# Patient Record
Sex: Female | Born: 1975
Health system: Southern US, Community
[De-identification: ages and names within clinical notes are randomized; demographics above are authoritative.]

## PROBLEM LIST (undated history)

## (undated) DIAGNOSIS — N189 Chronic kidney disease, unspecified: Secondary | ICD-10-CM

## (undated) DIAGNOSIS — E559 Vitamin D deficiency, unspecified: Secondary | ICD-10-CM

## (undated) DIAGNOSIS — F32A Depression, unspecified: Secondary | ICD-10-CM

## (undated) DIAGNOSIS — R002 Palpitations: Secondary | ICD-10-CM

## (undated) DIAGNOSIS — S83511A Sprain of anterior cruciate ligament of right knee, initial encounter: Secondary | ICD-10-CM

## (undated) DIAGNOSIS — S83249A Other tear of medial meniscus, current injury, unspecified knee, initial encounter: Secondary | ICD-10-CM

## (undated) DIAGNOSIS — J45909 Unspecified asthma, uncomplicated: Secondary | ICD-10-CM

## (undated) DIAGNOSIS — F419 Anxiety disorder, unspecified: Secondary | ICD-10-CM

## (undated) HISTORY — DX: Anxiety disorder, unspecified: F41.9

## (undated) HISTORY — DX: Chronic kidney disease, unspecified: N18.9

## (undated) HISTORY — DX: Vitamin D deficiency, unspecified: E55.9

## (undated) HISTORY — DX: Depression, unspecified: F32.A

## (undated) HISTORY — PX: TONSILLECTOMY AND ADENOIDECTOMY: SUR1326

---

## 1998-01-04 ENCOUNTER — Emergency Department (HOSPITAL_COMMUNITY): Admission: EM | Admit: 1998-01-04 | Discharge: 1998-01-04 | Payer: Self-pay | Admitting: Emergency Medicine

## 1999-05-12 ENCOUNTER — Emergency Department (HOSPITAL_COMMUNITY): Admission: EM | Admit: 1999-05-12 | Discharge: 1999-05-12 | Payer: Self-pay | Admitting: Emergency Medicine

## 1999-08-08 ENCOUNTER — Encounter: Payer: Self-pay | Admitting: *Deleted

## 1999-08-08 ENCOUNTER — Ambulatory Visit (HOSPITAL_COMMUNITY): Admission: RE | Admit: 1999-08-08 | Discharge: 1999-08-08 | Payer: Self-pay | Admitting: *Deleted

## 1999-09-26 ENCOUNTER — Inpatient Hospital Stay (HOSPITAL_COMMUNITY): Admission: AD | Admit: 1999-09-26 | Discharge: 1999-09-26 | Payer: Self-pay | Admitting: *Deleted

## 1999-09-27 ENCOUNTER — Inpatient Hospital Stay (HOSPITAL_COMMUNITY): Admission: AD | Admit: 1999-09-27 | Discharge: 1999-09-28 | Payer: Self-pay | Admitting: *Deleted

## 1999-09-27 ENCOUNTER — Encounter (INDEPENDENT_AMBULATORY_CARE_PROVIDER_SITE_OTHER): Payer: Self-pay | Admitting: Specialist

## 1999-09-29 ENCOUNTER — Encounter: Admission: RE | Admit: 1999-09-29 | Discharge: 1999-11-14 | Payer: Self-pay | Admitting: *Deleted

## 2000-06-08 ENCOUNTER — Inpatient Hospital Stay (HOSPITAL_COMMUNITY): Admission: AD | Admit: 2000-06-08 | Discharge: 2000-06-08 | Payer: Self-pay | Admitting: *Deleted

## 2000-08-12 ENCOUNTER — Emergency Department (HOSPITAL_COMMUNITY): Admission: EM | Admit: 2000-08-12 | Discharge: 2000-08-12 | Payer: Self-pay | Admitting: Emergency Medicine

## 2001-10-13 ENCOUNTER — Other Ambulatory Visit: Admission: RE | Admit: 2001-10-13 | Discharge: 2001-10-13 | Payer: Self-pay | Admitting: Obstetrics and Gynecology

## 2002-12-06 ENCOUNTER — Other Ambulatory Visit: Admission: RE | Admit: 2002-12-06 | Discharge: 2002-12-06 | Payer: Self-pay | Admitting: Obstetrics and Gynecology

## 2003-06-10 ENCOUNTER — Emergency Department (HOSPITAL_COMMUNITY): Admission: EM | Admit: 2003-06-10 | Discharge: 2003-06-10 | Payer: Self-pay | Admitting: Emergency Medicine

## 2003-11-26 ENCOUNTER — Emergency Department (HOSPITAL_COMMUNITY): Admission: EM | Admit: 2003-11-26 | Discharge: 2003-11-26 | Payer: Self-pay | Admitting: Family Medicine

## 2003-12-01 ENCOUNTER — Emergency Department (HOSPITAL_COMMUNITY): Admission: EM | Admit: 2003-12-01 | Discharge: 2003-12-01 | Payer: Self-pay | Admitting: Family Medicine

## 2003-12-05 ENCOUNTER — Emergency Department (HOSPITAL_COMMUNITY): Admission: EM | Admit: 2003-12-05 | Discharge: 2003-12-05 | Payer: Self-pay | Admitting: Family Medicine

## 2003-12-12 ENCOUNTER — Emergency Department (HOSPITAL_COMMUNITY): Admission: EM | Admit: 2003-12-12 | Discharge: 2003-12-12 | Payer: Self-pay | Admitting: Emergency Medicine

## 2003-12-13 ENCOUNTER — Emergency Department (HOSPITAL_COMMUNITY): Admission: EM | Admit: 2003-12-13 | Discharge: 2003-12-13 | Payer: Self-pay | Admitting: Family Medicine

## 2004-01-04 ENCOUNTER — Other Ambulatory Visit: Admission: RE | Admit: 2004-01-04 | Discharge: 2004-01-04 | Payer: Self-pay | Admitting: Obstetrics and Gynecology

## 2004-05-30 ENCOUNTER — Inpatient Hospital Stay (HOSPITAL_COMMUNITY): Admission: AD | Admit: 2004-05-30 | Discharge: 2004-05-30 | Payer: Self-pay | Admitting: Obstetrics and Gynecology

## 2005-01-17 ENCOUNTER — Encounter: Admission: RE | Admit: 2005-01-17 | Discharge: 2005-01-17 | Payer: Self-pay | Admitting: Cardiovascular Disease

## 2005-02-06 ENCOUNTER — Other Ambulatory Visit: Admission: RE | Admit: 2005-02-06 | Discharge: 2005-02-06 | Payer: Self-pay | Admitting: Obstetrics and Gynecology

## 2005-02-11 ENCOUNTER — Encounter: Admission: RE | Admit: 2005-02-11 | Discharge: 2005-03-25 | Payer: Self-pay | Admitting: Orthopedic Surgery

## 2005-02-14 ENCOUNTER — Encounter: Admission: RE | Admit: 2005-02-14 | Discharge: 2005-02-14 | Payer: Self-pay | Admitting: Obstetrics and Gynecology

## 2005-03-06 ENCOUNTER — Other Ambulatory Visit: Admission: RE | Admit: 2005-03-06 | Discharge: 2005-03-06 | Payer: Self-pay | Admitting: Obstetrics and Gynecology

## 2005-10-20 ENCOUNTER — Emergency Department (HOSPITAL_COMMUNITY): Admission: EM | Admit: 2005-10-20 | Discharge: 2005-10-20 | Payer: Self-pay | Admitting: Emergency Medicine

## 2006-04-20 ENCOUNTER — Other Ambulatory Visit: Admission: RE | Admit: 2006-04-20 | Discharge: 2006-04-20 | Payer: Self-pay | Admitting: Obstetrics and Gynecology

## 2006-11-11 ENCOUNTER — Inpatient Hospital Stay (HOSPITAL_COMMUNITY): Admission: AD | Admit: 2006-11-11 | Discharge: 2006-11-11 | Payer: Self-pay | Admitting: Obstetrics and Gynecology

## 2008-08-01 ENCOUNTER — Inpatient Hospital Stay (HOSPITAL_COMMUNITY): Admission: AD | Admit: 2008-08-01 | Discharge: 2008-08-01 | Payer: Self-pay | Admitting: Obstetrics and Gynecology

## 2008-09-15 ENCOUNTER — Inpatient Hospital Stay (HOSPITAL_COMMUNITY): Admission: AD | Admit: 2008-09-15 | Discharge: 2008-10-09 | Payer: Self-pay | Admitting: Obstetrics and Gynecology

## 2008-09-15 ENCOUNTER — Encounter: Payer: Self-pay | Admitting: Obstetrics and Gynecology

## 2008-09-22 ENCOUNTER — Encounter: Payer: Self-pay | Admitting: Obstetrics and Gynecology

## 2008-10-03 ENCOUNTER — Encounter: Payer: Self-pay | Admitting: Obstetrics and Gynecology

## 2008-10-09 ENCOUNTER — Encounter: Payer: Self-pay | Admitting: Obstetrics and Gynecology

## 2008-10-10 ENCOUNTER — Encounter: Admission: RE | Admit: 2008-10-10 | Discharge: 2008-10-10 | Payer: Self-pay | Admitting: Obstetrics and Gynecology

## 2008-12-20 ENCOUNTER — Inpatient Hospital Stay (HOSPITAL_COMMUNITY): Admission: AD | Admit: 2008-12-20 | Discharge: 2008-12-23 | Payer: Self-pay | Admitting: Obstetrics and Gynecology

## 2009-05-05 IMAGING — US US OB COMP +14 WK
2 series · 14 of 28 positions shown · non-contrast
Comparison: none

OBSTETRICAL ULTRASOUND:
 This ultrasound exam was performed in the [HOSPITAL] Ultrasound Department.  The OB US report was generated in the AS system, and faxed to the ordering physician.  This report is also available in [REDACTED] PACS.

[Series 1: us ob comp +14 wk · 9 of 57 slices shown (1 of 2)]
[im 4/57]
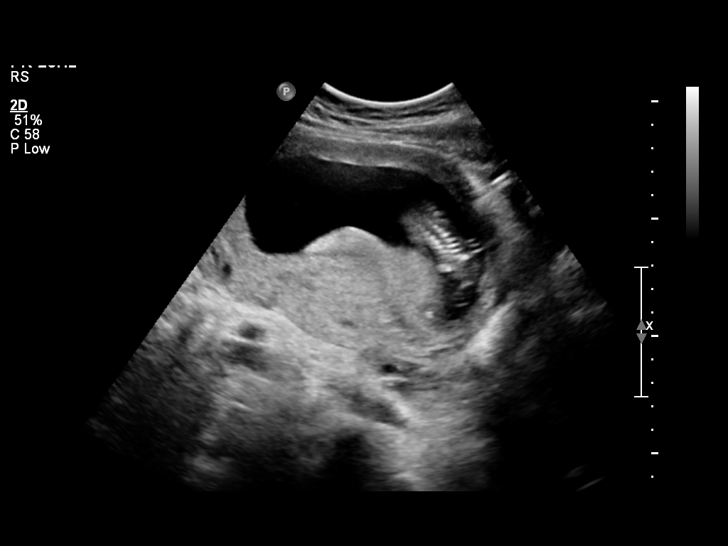
[im 10/57]
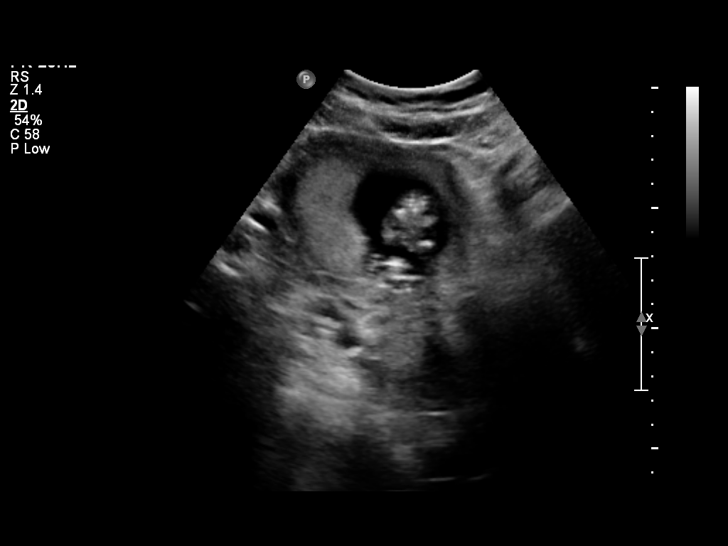
[im 16/57]
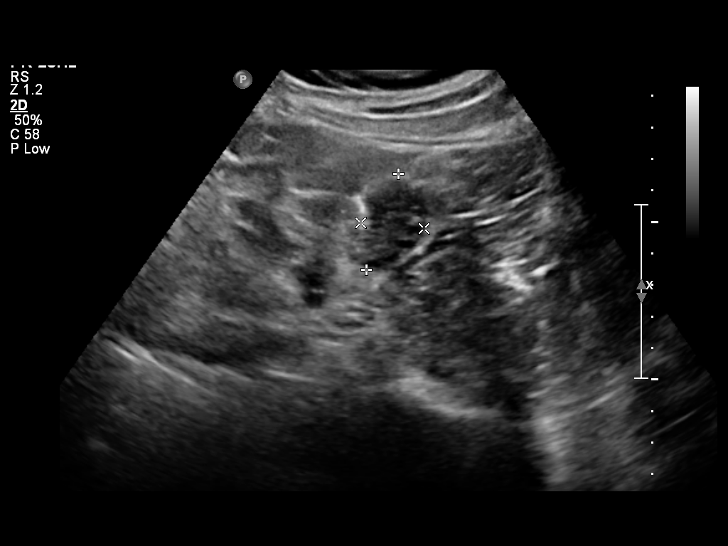
[im 22/57]
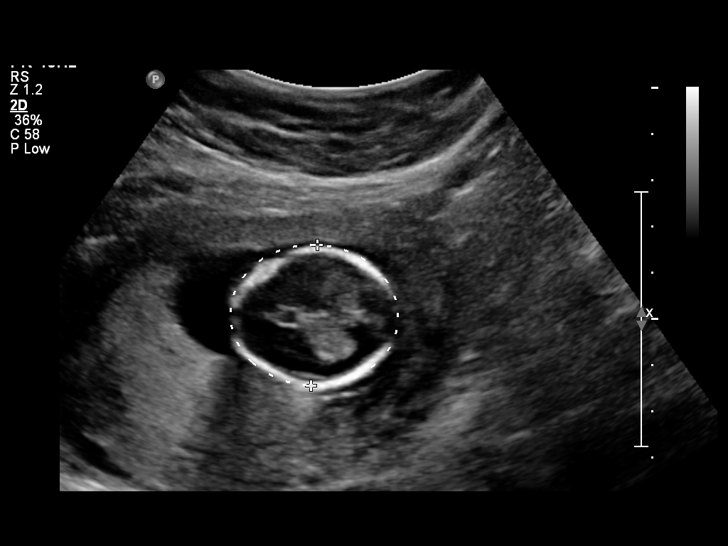
[im 29/57]
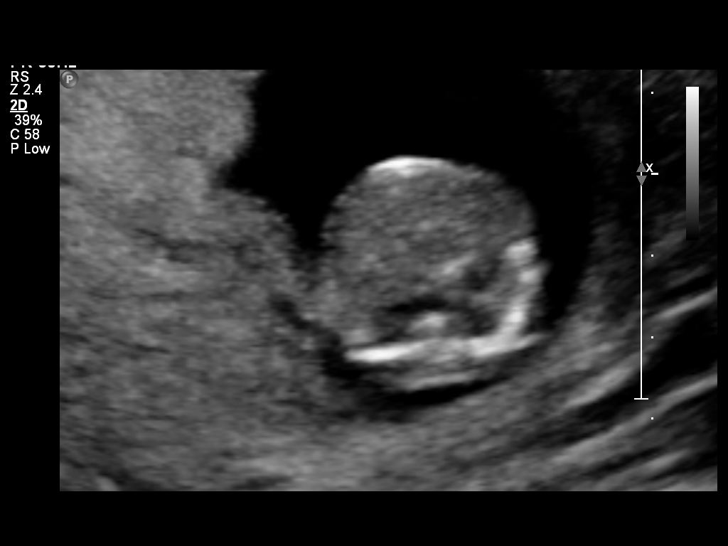
[im 35/57]
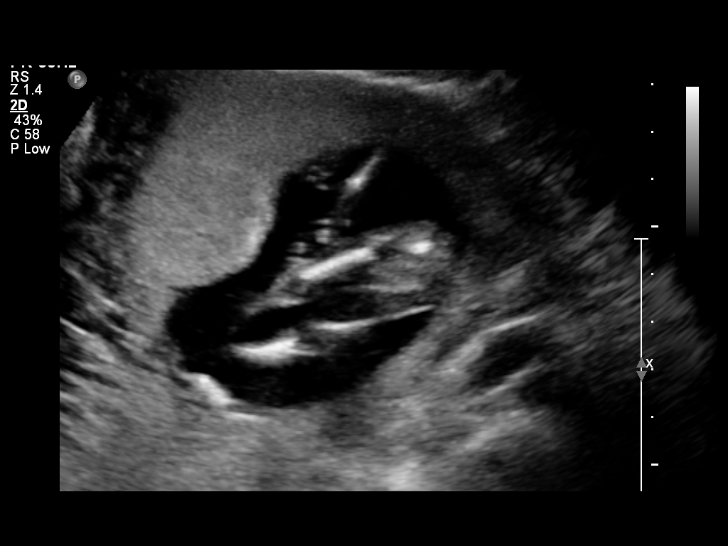
[im 41/57]
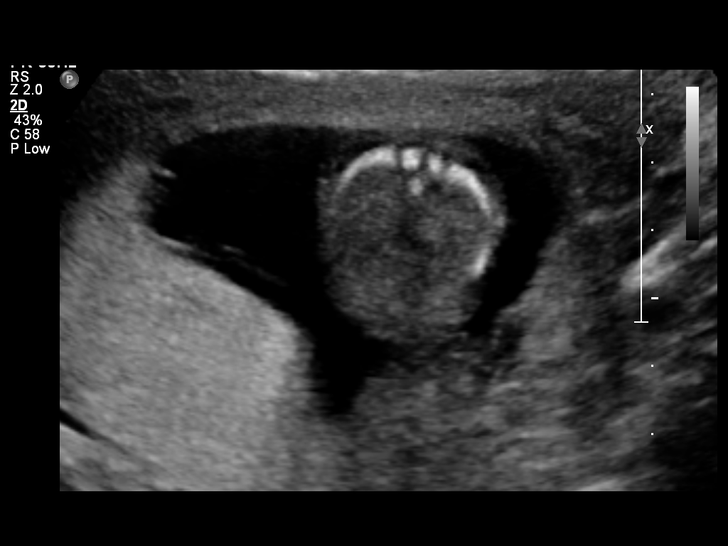
[im 47/57]
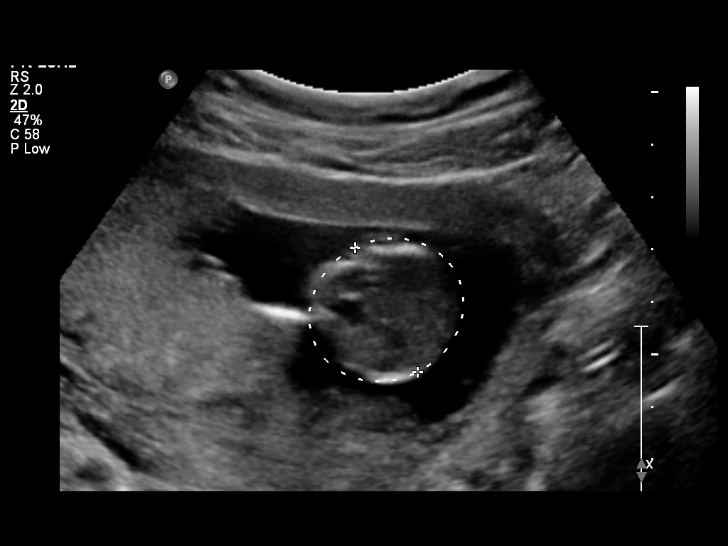
[im 53/57]
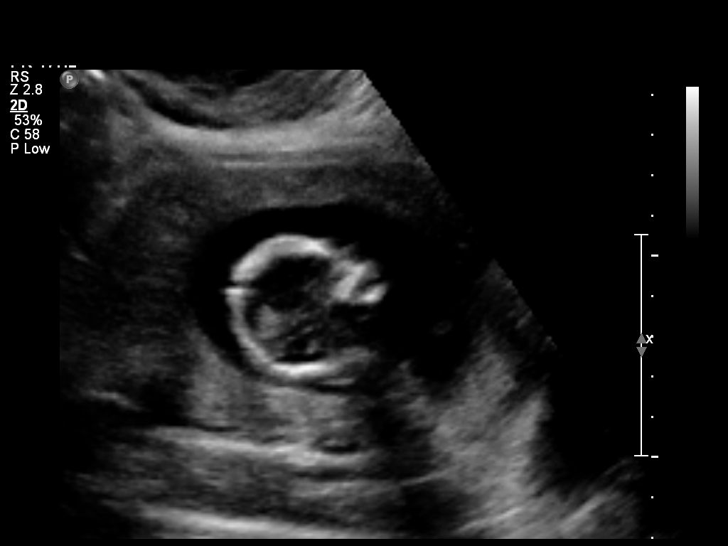

[Series 1: us ob comp +14 wk · 5 of 29 slices shown (2 of 2)]
[im 1/29]
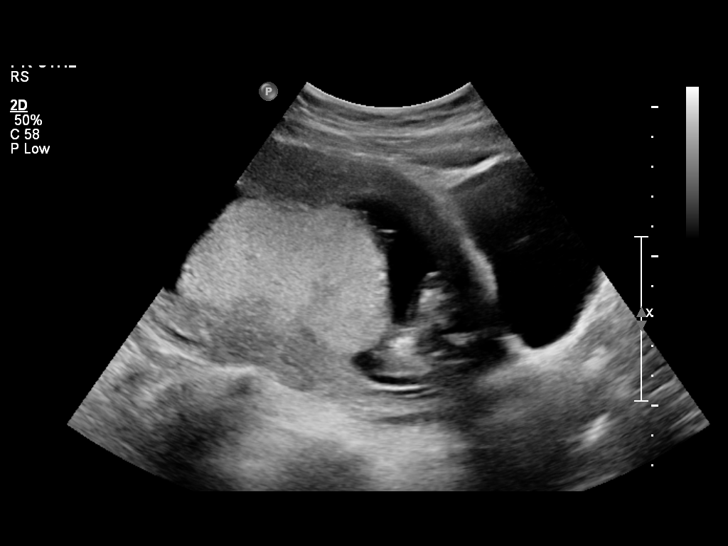
[im 8/29]
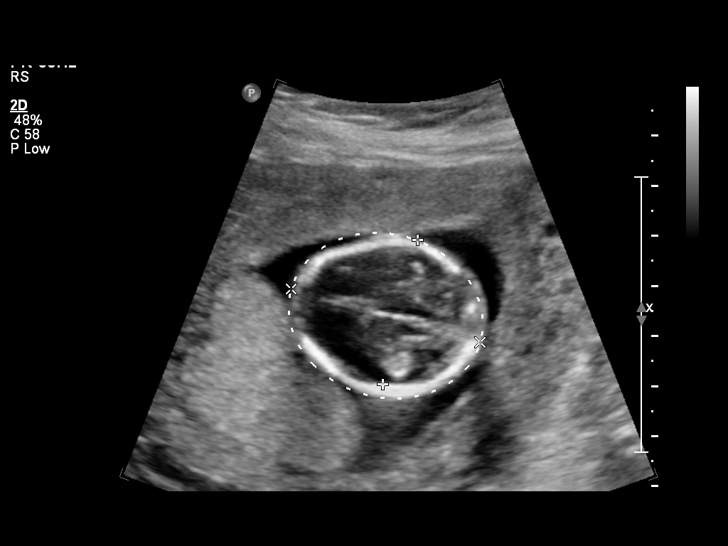
[im 15/29]
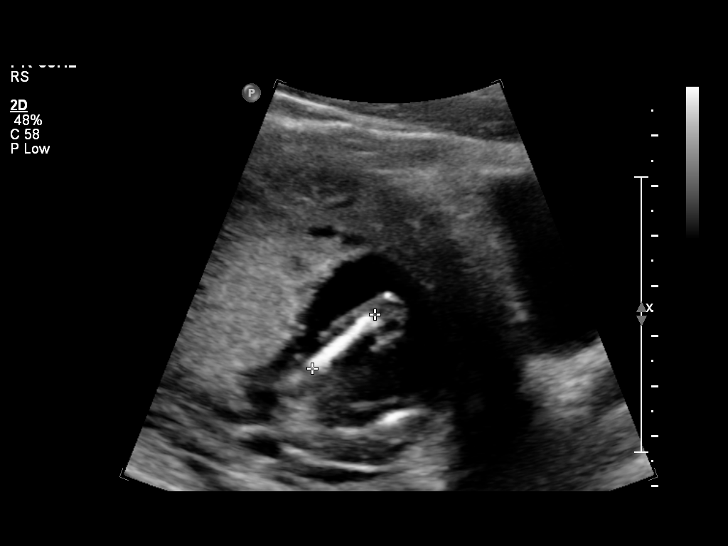
[im 22/29]
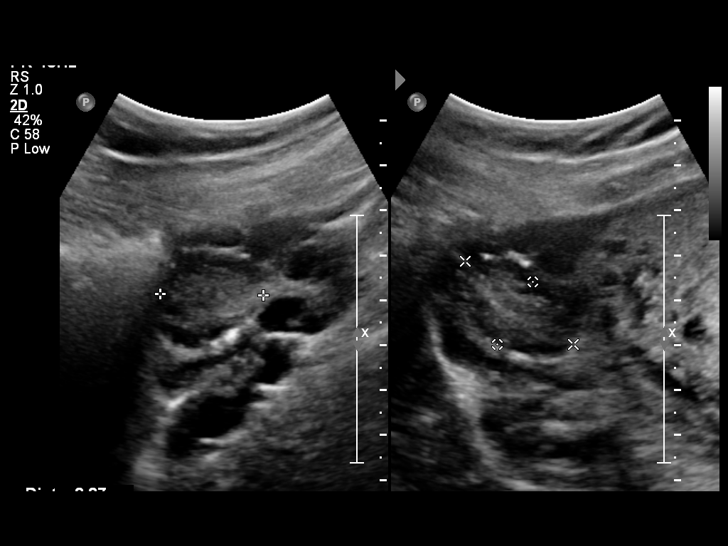
[im 29/29]
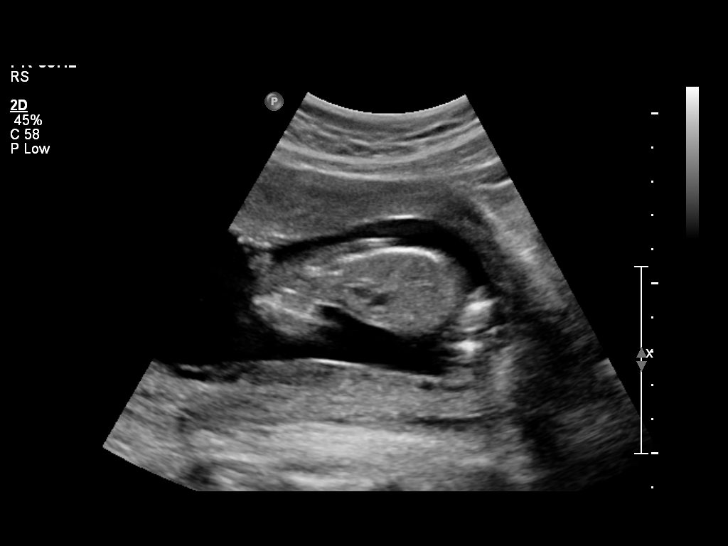

[14 of 28 positions shown; findings below may reference images not displayed]

IMPRESSION: See AS Obstetric US report.

## 2009-09-24 ENCOUNTER — Inpatient Hospital Stay (HOSPITAL_COMMUNITY): Admission: AD | Admit: 2009-09-24 | Discharge: 2009-09-24 | Payer: Self-pay | Admitting: Obstetrics and Gynecology

## 2010-06-15 ENCOUNTER — Encounter: Payer: Self-pay | Admitting: Obstetrics and Gynecology

## 2010-09-01 LAB — CBC
HCT: 27 % — ABNORMAL LOW (ref 36.0–46.0)
HCT: 35.9 % — ABNORMAL LOW (ref 36.0–46.0)
Hemoglobin: 11.9 g/dL — ABNORMAL LOW (ref 12.0–15.0)
Hemoglobin: 9 g/dL — ABNORMAL LOW (ref 12.0–15.0)
MCHC: 33.3 g/dL (ref 30.0–36.0)
MCHC: 33.5 g/dL (ref 30.0–36.0)
MCV: 81.7 fL (ref 78.0–100.0)
MCV: 82.2 fL (ref 78.0–100.0)
Platelets: 193 10*3/uL (ref 150–400)
Platelets: 231 10*3/uL (ref 150–400)
RBC: 3.29 MIL/uL — ABNORMAL LOW (ref 3.87–5.11)
RBC: 4.39 MIL/uL (ref 3.87–5.11)
RDW: 14.5 % (ref 11.5–15.5)
RDW: 14.9 % (ref 11.5–15.5)
WBC: 12.1 10*3/uL — ABNORMAL HIGH (ref 4.0–10.5)
WBC: 9.3 10*3/uL (ref 4.0–10.5)

## 2010-09-01 LAB — RPR: RPR Ser Ql: NONREACTIVE

## 2010-09-01 LAB — GLUCOSE, CAPILLARY
Glucose-Capillary: 100 mg/dL — ABNORMAL HIGH (ref 70–99)
Glucose-Capillary: 70 mg/dL (ref 70–99)
Glucose-Capillary: 91 mg/dL (ref 70–99)
Glucose-Capillary: 98 mg/dL (ref 70–99)

## 2010-09-01 LAB — STREP B DNA PROBE: Strep Group B Ag: NEGATIVE

## 2010-09-03 LAB — GLUCOSE, CAPILLARY
Glucose-Capillary: 102 mg/dL — ABNORMAL HIGH (ref 70–99)
Glucose-Capillary: 105 mg/dL — ABNORMAL HIGH (ref 70–99)
Glucose-Capillary: 106 mg/dL — ABNORMAL HIGH (ref 70–99)
Glucose-Capillary: 117 mg/dL — ABNORMAL HIGH (ref 70–99)
Glucose-Capillary: 127 mg/dL — ABNORMAL HIGH (ref 70–99)
Glucose-Capillary: 144 mg/dL — ABNORMAL HIGH (ref 70–99)
Glucose-Capillary: 55 mg/dL — ABNORMAL LOW (ref 70–99)
Glucose-Capillary: 56 mg/dL — ABNORMAL LOW (ref 70–99)
Glucose-Capillary: 82 mg/dL (ref 70–99)
Glucose-Capillary: 97 mg/dL (ref 70–99)
Glucose-Capillary: 98 mg/dL (ref 70–99)
Glucose-Capillary: 100 mg/dL — ABNORMAL HIGH (ref 70–99)
Glucose-Capillary: 101 mg/dL — ABNORMAL HIGH (ref 70–99)
Glucose-Capillary: 103 mg/dL — ABNORMAL HIGH (ref 70–99)
Glucose-Capillary: 104 mg/dL — ABNORMAL HIGH (ref 70–99)
Glucose-Capillary: 104 mg/dL — ABNORMAL HIGH (ref 70–99)
Glucose-Capillary: 105 mg/dL — ABNORMAL HIGH (ref 70–99)
Glucose-Capillary: 107 mg/dL — ABNORMAL HIGH (ref 70–99)
Glucose-Capillary: 108 mg/dL — ABNORMAL HIGH (ref 70–99)
Glucose-Capillary: 108 mg/dL — ABNORMAL HIGH (ref 70–99)
Glucose-Capillary: 108 mg/dL — ABNORMAL HIGH (ref 70–99)
Glucose-Capillary: 108 mg/dL — ABNORMAL HIGH (ref 70–99)
Glucose-Capillary: 109 mg/dL — ABNORMAL HIGH (ref 70–99)
Glucose-Capillary: 110 mg/dL — ABNORMAL HIGH (ref 70–99)
Glucose-Capillary: 114 mg/dL — ABNORMAL HIGH (ref 70–99)
Glucose-Capillary: 114 mg/dL — ABNORMAL HIGH (ref 70–99)
Glucose-Capillary: 115 mg/dL — ABNORMAL HIGH (ref 70–99)
Glucose-Capillary: 118 mg/dL — ABNORMAL HIGH (ref 70–99)
Glucose-Capillary: 125 mg/dL — ABNORMAL HIGH (ref 70–99)
Glucose-Capillary: 126 mg/dL — ABNORMAL HIGH (ref 70–99)
Glucose-Capillary: 131 mg/dL — ABNORMAL HIGH (ref 70–99)
Glucose-Capillary: 131 mg/dL — ABNORMAL HIGH (ref 70–99)
Glucose-Capillary: 131 mg/dL — ABNORMAL HIGH (ref 70–99)
Glucose-Capillary: 132 mg/dL — ABNORMAL HIGH (ref 70–99)
Glucose-Capillary: 132 mg/dL — ABNORMAL HIGH (ref 70–99)
Glucose-Capillary: 135 mg/dL — ABNORMAL HIGH (ref 70–99)
Glucose-Capillary: 136 mg/dL — ABNORMAL HIGH (ref 70–99)
Glucose-Capillary: 137 mg/dL — ABNORMAL HIGH (ref 70–99)
Glucose-Capillary: 138 mg/dL — ABNORMAL HIGH (ref 70–99)
Glucose-Capillary: 140 mg/dL — ABNORMAL HIGH (ref 70–99)
Glucose-Capillary: 143 mg/dL — ABNORMAL HIGH (ref 70–99)
Glucose-Capillary: 143 mg/dL — ABNORMAL HIGH (ref 70–99)
Glucose-Capillary: 143 mg/dL — ABNORMAL HIGH (ref 70–99)
Glucose-Capillary: 147 mg/dL — ABNORMAL HIGH (ref 70–99)
Glucose-Capillary: 150 mg/dL — ABNORMAL HIGH (ref 70–99)
Glucose-Capillary: 151 mg/dL — ABNORMAL HIGH (ref 70–99)
Glucose-Capillary: 163 mg/dL — ABNORMAL HIGH (ref 70–99)
Glucose-Capillary: 203 mg/dL — ABNORMAL HIGH (ref 70–99)
Glucose-Capillary: 55 mg/dL — ABNORMAL LOW (ref 70–99)
Glucose-Capillary: 60 mg/dL — ABNORMAL LOW (ref 70–99)
Glucose-Capillary: 71 mg/dL (ref 70–99)
Glucose-Capillary: 78 mg/dL (ref 70–99)
Glucose-Capillary: 79 mg/dL (ref 70–99)
Glucose-Capillary: 79 mg/dL (ref 70–99)
Glucose-Capillary: 85 mg/dL (ref 70–99)
Glucose-Capillary: 86 mg/dL (ref 70–99)
Glucose-Capillary: 86 mg/dL (ref 70–99)
Glucose-Capillary: 87 mg/dL (ref 70–99)
Glucose-Capillary: 88 mg/dL (ref 70–99)
Glucose-Capillary: 88 mg/dL (ref 70–99)
Glucose-Capillary: 88 mg/dL (ref 70–99)
Glucose-Capillary: 89 mg/dL (ref 70–99)
Glucose-Capillary: 89 mg/dL (ref 70–99)
Glucose-Capillary: 90 mg/dL (ref 70–99)
Glucose-Capillary: 90 mg/dL (ref 70–99)
Glucose-Capillary: 90 mg/dL (ref 70–99)
Glucose-Capillary: 91 mg/dL (ref 70–99)
Glucose-Capillary: 93 mg/dL (ref 70–99)
Glucose-Capillary: 97 mg/dL (ref 70–99)
Glucose-Capillary: 97 mg/dL (ref 70–99)
Glucose-Capillary: 98 mg/dL (ref 70–99)
Glucose-Capillary: 98 mg/dL (ref 70–99)
Glucose-Capillary: 99 mg/dL (ref 70–99)
Glucose-Capillary: 99 mg/dL (ref 70–99)

## 2010-09-04 LAB — STREP B DNA PROBE: Strep Group B Ag: NEGATIVE

## 2010-09-04 LAB — GLUCOSE, CAPILLARY
Glucose-Capillary: 150 mg/dL — ABNORMAL HIGH (ref 70–99)
Glucose-Capillary: 78 mg/dL (ref 70–99)

## 2010-09-04 LAB — CBC
HCT: 32.3 % — ABNORMAL LOW (ref 36.0–46.0)
Hemoglobin: 10.9 g/dL — ABNORMAL LOW (ref 12.0–15.0)
MCHC: 33.8 g/dL (ref 30.0–36.0)
MCV: 82 fL (ref 78.0–100.0)
Platelets: 205 10*3/uL (ref 150–400)
RBC: 3.93 MIL/uL (ref 3.87–5.11)
RDW: 14.2 % (ref 11.5–15.5)
WBC: 10.5 10*3/uL (ref 4.0–10.5)

## 2010-09-04 LAB — GLUCOSE, 3 HOUR GESTATIONAL: Glucose, GTT - 3 Hour: 124 mg/dL (ref 70–144)

## 2010-09-04 LAB — GC/CHLAMYDIA PROBE AMP, GENITAL
Chlamydia, DNA Probe: NEGATIVE
GC Probe Amp, Genital: NEGATIVE

## 2010-09-04 LAB — GLUCOSE, 1 HOUR GESTATIONAL: Glucose Tolerance, 1 hour: 252 mg/dL — ABNORMAL HIGH (ref 70–189)

## 2010-09-04 LAB — GLUCOSE, FASTING GESTATIONAL: Glucose Tolerance, Fasting: 126 mg/dL

## 2010-09-04 LAB — GLUCOSE, 2 HOUR GESTATIONAL: Glucose Tolerance, 2 hour: 246 mg/dL — ABNORMAL HIGH (ref 70–164)

## 2010-09-05 LAB — GC/CHLAMYDIA PROBE AMP, GENITAL
Chlamydia, DNA Probe: NEGATIVE
GC Probe Amp, Genital: NEGATIVE

## 2010-09-05 LAB — WET PREP, GENITAL
Clue Cells Wet Prep HPF POC: NONE SEEN
Yeast Wet Prep HPF POC: NONE SEEN

## 2010-09-05 LAB — URINE MICROSCOPIC-ADD ON

## 2010-09-05 LAB — URINE CULTURE
Colony Count: 25000
Special Requests: NEGATIVE

## 2010-09-05 LAB — URINALYSIS, ROUTINE W REFLEX MICROSCOPIC
Bilirubin Urine: NEGATIVE
Glucose, UA: NEGATIVE mg/dL
Hgb urine dipstick: NEGATIVE
Ketones, ur: NEGATIVE mg/dL
Nitrite: NEGATIVE
Protein, ur: NEGATIVE mg/dL
Specific Gravity, Urine: 1.02 (ref 1.005–1.030)
Urobilinogen, UA: 0.2 mg/dL (ref 0.0–1.0)
pH: 7 (ref 5.0–8.0)

## 2010-10-08 NOTE — H&P (Signed)
NAMEJACKLINE, Zoe Morris            ACCOUNT NO.:  [id number]   MEDICAL RECORD NO.:  [id number]          PATIENT TYPE:  INP   LOCATION:  9175                          FACILITY:  [facility]   PHYSICIAN:  Naima A. Dillard, M.D. DATE OF BIRTH:  1975/11/26   DATE OF ADMISSION:  12/20/2008  DATE OF DISCHARGE:                              HISTORY & PHYSICAL   [name] is a 35 year old gravida 8, para 0-3-4-3 at 35.2 weeks with  cerclage in place since 21-1/2 weeks' gestation.  She presents tonight  with a history of spontaneous rupture of membranes, clear fluid at 8:20  p.m.Marland Kitchen  She is followed by the doctors at [facility] and her  pregnancy is remarkable for history of preterm premature rupture of  membranes with two prior pregnancies, history of preterm delivery prior  to 57 weeks' gestation with her three other children, history of cardiac  problems and cardiac evaluation, history of gestational diabetes, late  to care, childhood asthma, abnormal Pap and cryotherapy, septate uterus  and gestational diabetes, and rescue cerclage with this pregnancy and  antenatal hospitalization from 21-[redacted] weeks gestation, history of  Trichomonas.   PRENATAL LABS:  [name] prenatal labs included an initial  hemoglobin and hematocrit of 11.5 and 34.2, platelets 243,000, blood  type A plus, antibody screen negative, sickle cell trait screen  negative, rubella immune, hepatitis B negative, HIV nonreactive.  Pap  normal.  Negative gonorrhea and chlamydia.  Quad screen negative.  Failed 1-hour Glucola, failed 3-hour Glucola.  She had a negative beta  strep test during her hospitalization in April.  But she did not have a  more recent beta strep culture so one was done today upon her admission.   ALLERGIES:  [name] has no known allergies to food, medications or  latex.   CURRENT MEDICATIONS:  1. Glyburide 2.5 mg daily.  2. Prenatal vitamin daily.   HISTORY OF PRESENT PREGNANCY:  Began  prenatal care at 16.5 weeks with an  OB interview at 18.2 weeks.  She had her first visit concurrent with her  anatomy ultrasound.  At that time she was given some Flagyl for  bacterial vaginosis and a quad screen was done which came back normal  and 17P was begun per history of preterm delivery.  She was sent over  maternal fetal medicine for a consult that was recommended by Dr.  Pennie Rushing.  At that maternal fetal medicine visit she was found to have a  cervical shortening and was sent directly over to the antenatal unit for  inpatient admission and the next day she got her rescue cerclage.  She  was 21-1/2 weeks at that time.  She remained hospitalized in the  antenatal unit until 25 weeks.  During that time she was diagnosed with  gestational diabetes and she received two doses of betamethasone on May  9 at [redacted] weeks gestation.  She continued to have her 17P shot and was  seen regularly at the office following her gestational diabetes and her  fetal growth.  At 32 weeks she began her twice weekly NSTs and she had a  couple follow-up ultrasounds and normal BPPs, normal growth and normal  AFI were found.  She presents today with spontaneous rupture of  membranes while walking.  She had been planning to have her cerclage  removed at 37 weeks I believe.  She is currently 35 weeks and 2 days.   OBSTETRICAL HISTORY:  [name] has been pregnant eight times.  Gravida #1 resulted in the birth of a son born in October 1991 at 26  weeks.  He weighed 2 pounds 3 ounces.  Gravida #2 resulted in the birth  of another son born in September 11, 1993 at 27 weeks.  He weighed 2 pounds  8 ounces.  Gravida #1 and II were prolonged premature rupture of  membranes.  Gravida #3 resulted in the birth of a daughter born in May  2001 at 4 weeks' gestation.  She weighed 2 pounds 10 ounces.  She did  not have prolonged premature rupture of membranes, just a preterm  delivery.  Gravida #4 termination in 2002.   Gravida #5 a termination in  2006.  Gravida #6 termination in 2008.  Gravida #7 termination in 2009.  Gravida #8 current pregnancy.   MEDICAL HISTORY:  [name] has a history of prolonged premature  rupture of membranes x2 pregnancies and preterm delivery less than 30  weeks x3 pregnancies.  She has used birth control the past including the  patch and Femcon.  She had an abnormal Pap and cryotherapy in 1992.  She  had a diagnosis of septate uterus made on the ultrasound with maternal  fetal medicine and she has a history of Trichomonas in March of this  year.  She had the usual childhood illnesses including chicken pox.  She  had childhood asthma and has not needed an inhaler for more than 20  years.  She had a cardiac workup for palpitations and arrhythmia with  Dr. Algie Coffer on evaluation a year and half ago but says she did not go  back for a follow-up visit.  She has a varicose vein.   FAMILY HISTORY:  Her paternal grandmother has a pacemaker and paternal  grandmother and mother, paternal uncle and maternal grandfather all have  chronic hypertension.  Maternal grandmother has varicose veins.  Sister  has anemia.  Paternal grandfather, paternal uncle and cousins have  diabetes.   SURGICAL HISTORY:  The patient had tonsils removed at age 59.   GENETIC HISTORY:  Father of the baby has an extra digit.  Her second  child has an extra digit.  The father of the baby's grandmother has  muscular dystrophy.  The patient had a negative sickle cell screen and a  negative quad screen.   SOCIAL HISTORY:  [name] is single African American.  She declines  to state their religion.  Father of the baby's name is Tiburcio Bash Little.  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  The father of the  baby has a high school diploma and the patient denies use of cigarettes,  alcohol or street drugs during this pregnancy.   PHYSICAL EXAMINATION:  VITAL SIGNS:  The patient is afebrile today  with  stable vital signs.  HEENT:  Normal.  LUNGS:  Clear to auscultation bilaterally.  HEART:  Heart is regular rate and rhythm without murmurs.  BREASTS:  Soft and nontender.  ABDOMEN:  Abdomen is soft and nontender.  Gravid, appropriate for  gestational age.  Uterus soft to palpation between contractions.  Fetal  heart  rate has a baseline of 145 with variability present and  accelerations present meeting the 15/15 criteria for reactivity.  Uterine contractions are occurring every 7-8 minutes, lasting 60  seconds, of moderate strength.  EXTREMITIES:  There is no edema of extremities.  DTRs are normal.  Denna Haggard' sign is negative x2.  SPECULUM EXAM:  There is copious clear liquids leaking out during  contractions.  Per digital exam of the cervix, the cerclage is in place  and the cervix is dilated 3 cm, minus 3 station.   IMPRESSION:  A 35 year old gravida 8, para 0-3-4-3 at 35.[redacted] weeks  gestation, cerclage in place, spontaneous rupture of membranes, septate  uterus, gestational diabetes.   PLAN:  Admit to labor and delivery, MD management.  Glucose checks  every2 hours.  Dr. Normand Sloop is on her way to remove the cerclage and GBS  cultures have been sent.      Janna Melsness, CNM      Naima A. Normand Sloop, M.D.  Electronically Signed    JM/MEDQ  D:  12/20/2008  T:  12/21/2008  Job:  [id number]

## 2010-10-08 NOTE — Discharge Summary (Signed)
Zoe, Morris            ACCOUNT NO.:  [id number]   MEDICAL RECORD NO.:  [id number]          PATIENT TYPE:  INP   LOCATION:  9152                          FACILITY:  [facility]   PHYSICIAN:  Janine Limbo, M.D.DATE OF BIRTH:  06-07-1975   DATE OF ADMISSION:  09/15/2008  DATE OF DISCHARGE:  10/09/2008                               DISCHARGE SUMMARY   ADMITTING DIAGNOSES:  1. Intrauterine pregnancy at 21-4/7th weeks with shortened cervix and      funneling per ultrasound with MSM.  2. Questionable history of septate uterus.  3. History of preterm delivery x3.  4. Irregular heart rate.  5. Elevated blood glucose.  19. 35 year old gravida 8, para 0-3-4-3.  7. Uterine fibroids.   DISCHARGE DIAGNOSES:  1. Intrauterine pregnancy at 25 weeks.  2. Incompetent cervix status post rescue cerclage September 16, 2008.  3. Stable cervical length with cervix measuring between 2 and 3.2 cm      on Oct 09, 2008.  4. Gestational diabetes, controlled with p.o. glyburide.  5. History of anemia 424 with hemoglobin equal to 10.9.   HOSPITAL PROCEDURES:  1. Betamethasone course which she received her first dose Sep 30, 2008,      and second dose Oct 01, 2008.  2. Spinal anesthesia.  3. Rescue cerclage September 16, 2008.  4. Serial ultrasounds to evaluate cervical length as well as fetal      growth.   HOSPITAL COURSE:  [name] is a 35 year old gravida 8, para 0-3-4-3  who presented on the day of admission 21-4/7th weeks for admission per  Dr. Katrinka Blazing MSM for concerns regarding shortened cervix with funneling.  The patient does have a history of preterm delivery x3 at respectively  26, 27 and 20.  She has been followed by the MD service at [facility].  On her date of admission, she had had a consult with  Dr. Roney Mans at [facility] on September 15, 2008.  Transvaginal ultrasound did show funneling with a functional cervical  length of 1.3 cm.  Prior to that, her last  cervical length on August 23, 2008, was 3.61 cm.  She had been receiving 17-P injections secondary to  her history of preterm birth, which she typically gets them on  Wednesdays.  She had reported one episode of vaginal bleeding during the  pregnancy.  Denied any other contractions or leakage of fluid, did  report to Dr. Katrinka Blazing that she had some increased vaginal pressure, was  standing and doing chores, otherwise her pregnancy had been progressing  well.  Pregnancy and history remarkable for  1. PPROM x2 with her first 2 babies.  2. History of preterm delivery x3.  3. History of cardiac problems with a cardiac evaluation regarding      palpitations and irregular rhythm of her heart rate.  4. Elevated glucose at her prenatal visit on September 14, 2008.  5. History of Trichomonas.  6. Late to care.  7. Childhood history of asthma.  8. History of abnormal Pap and cryotherapy in 1992.  9. History of septate uterus.   She  had a capillary blood glucose equal to 181 on September 14, 2008.  She  began prenatal care at [facility] at 16-1/2 weeks.  She had  anatomy ultrasound that at 18-2/7th weeks, was treated for bacterial  vaginosis at that time.  Quad screen was done and did come back normal.  Secondary to her history, she was sent for consult with Maternal Fetal  Medicine by Dr. Pennie Rushing and then following that consult and results of  ultrasound was sent for direct admission from the office to the  antenatal where she has remained until today Oct 09, 2008.  On  admission, her abdomen was soft and nontender.  Gravid uterus and  umbilicus and appropriate for gestational age.  It was soft to  palpation.  There were no contractions per TOCO.  She had no leakage or  vaginal bleeding from vagina.  She was placed in Trendelenburg on strict  bedrest to evaluate best course of action regarding a shortening cervix.  Cerclage was recommended if no imminent labor and delivery were noted.  Risks and  benefits and alternatives were discussed with the patient by  both Dr. Roney Mans as well as Dr. Dierdre Forth.  Dr. Katrinka Blazing did  also note in her consultation that no uterine anomalies were seen on  ultrasound, however that best imaging of the uterus would be a non-  pregnant state with HSG versus diagnostic laparoscopy versus a  hysteroscopy would be the most helpful and clarifying type of  questionable anomaly per history.  Dr. 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  Fetal heart rate was in the  140s and reassuring on admission.  GBS and GC Chlamydia cultures were  done and were negative.  On physical exam, cervix was closed and long.  No visible dilatation.  As the patient was monitored after admission, no  imminent labor or contractions were noted and the patient did proceed  with rescue cerclage September 16, 2008, was performed by Dr. Pennie Rushing under  spinal anesthesia.  Please see her operative note for any other  clarification regarding the procedure and findings that was performed at  21 weeks and 5/7 days.  On the day following cerclage placement September 17, 2008, the patient was without complaints, was doing well, did have a  small amount of bleeding with wiping and no contractions were noted.   She was on ibuprofen to prevent any preterm contractions, and she did  remain in Trendelenburg.  Plan was made for the patient to have 1-hour  Glucola at 23-4/7th weeks.  The patient did have some bouts with  constipation during her stay.  Juice cocktails as well as diet  modifications, Colace and intermittent MiraLax were utilized during her  hospitalization.  She did have a nutrition consult on September 18, 2008.  The patient was on VTE prophylaxis with both daytime SCDs as well as TED  hose during the day.  The patient did have b.i.d. fetal heart tones  until 23-6/7th weeks mark and since that point she has had NSTs t.i.d.  She has had some periods of  irritability episodes of uterine  irritability.  During her hospitalization fetal heart tones have been  reassuring.  The patient did have pastoral care visits from time-to-time  during this stay secondary to some psychosocial issues and housing needs  and needs were met.  Her vital signs were stable, and she was afebrile  during her stay.  On September 22, 2008, at approximately  22-1/2 weeks, the  patient was diagnosed with gestational diabetes following a 3-hour GTT.  She did have diabetes teaching as well as discussion of checking her  CBGs both fasting and 2-hour postprandial the following day.  She did  have some emotional instability and complaints of depression related to  the day diabetes diagnosis around the Sep 23, 2008, but continued doing  well, denying pain or bleeding, some around the time of the cerclage  placement, continued having good fetal movement.  Fasting blood sugar on  Sep 23, 2008, was 125 and 2-hour postprandial were between 78 and 150.  On Sep 24, 2008, her fasting blood sugar was 105 and before meals were 98  and highest after meal was 143.  Starting on Sep 24, 2008, she was begun  on glyburide 2.5 mg p.o. q.a.m. before breakfast.   On Sep 25, 2008, fasting blood sugar was 101, 2-hour PCs range 79-118.  She did on Sep 26, 2008, have a social work consult.  On Sep 26, 2008,  fasting blood sugar 104, 2-hour postprandials were within normal limits  overall.  On September 22, 2008, the patient did have a followup ultrasound  and AFI was within normal limits.  Cervical length was measuring 1.8 cm,  final length was 1.4 cm, final width was 2.7 cm.  Cerclage was  visualized as well as a nabothian cyst.  Cerclage suture did appear  intact with good placement and funneling was not seen distal to the  cerclage.  Followup ultrasound was done on Oct 03, 2008, cephalic  presentation was noted.  Placenta was right lateral above os.  AFI was  subjectively within normal limits.  Estimated  fetal weight was 1 pound 7  ounces which was 50th percentile on growth fetal heart rate was 157  limited anatomy that was seen was within normal limits.  Cervical length  did improve to 2.6 cm and that was measured transvaginally.  Cerclage  appeared intact and cervix appeared closed without funneling or dynamic  change.  The patient did receive her betamethasone course Sep 30, 2008,  as well as Oct 01, 2008, following that she did have some continued blood  sugar elevations related to the course and was covered with both NPH  insulin as well as NovoLog sliding scale insulin beginning on Oct 02, 2008, until Oct 07, 2008.  When was restarted on glyburide 2.5 mg, did  have some hypoglycemia and thus on Oct 09, 2008, was decreased down to  glyburide 1.25 mg and had been stable.  Fasting blood sugar this morning  was 85 and 2 hours after breakfast was 105 and that is on her 1.25 mg of  glyburide.   HOSPITAL STAY:  The patient has continued doing very well.  No  complaints of uterine contractions or abdominal tightening, has not had  any fundal tenderness, no leakage of fluid or vaginal bleeding to note.  At the beginning Oct 03, 2008, the patient did begin bathroom privileges  as well as 5 minutes shower, but sitting while in her shower.  With good  ultrasound on Oct 03, 2008, plan was made to continue to observe and  repeat her ultrasound in 1 week.  Her ultrasound today showed cervical  length appearing closed and varied from 2.0-3.2 cm.  Per Maternal Fetal  Medicine appeared dynamic but no funneling, passed cerclage and after  discussion with MSM and Dr. Stefano Gaul plan was made to discharge the  patient home on bedrest secondary to  stable cervical length.  The  patient was agreeable with this plan and agreeable to remain compliant  with bedrest modifications as well as preterm labor precautions as well  as diligence with diabetes management.  She was deemed to have received  full benefit of  her hospital stay and was discharged home Oct 09, 2008,  in stable condition.   DISCHARGE MEDICATIONS:  1. Glyburide 1.25 mg p.o. q.a.m.  2. Colace 200 mg p.o. daily.  3. Motrin 600 mg p.o. q.6 h. p.r.n., six or more contractions at 1      hour.  4. MiraLax 17 g daily p.r.n. no bowel movement in 3 days or p.r.n.      constipation.   The patient was given prescriptions for both obtaining a Glucometer  tonight after discharge as well as prescription for lancets.  The  patient is to also be scheduled for outpatient diabetes management and  teaching, and she is to continue her 17-P weekly injections on Wednesday  per Dr. Stefano Gaul.  Followup is to occur in our office either this coming  Thursday or Friday.  Our office is to call the patient tomorrow to  schedule the diabetes management as well as her 17-P injection on  Wednesday and a followup appointment on Thursday or Friday with  available MD at office.  She is to continue weekly cervical length.  She  is to continue preterm labor precautions, bedrest and activities allowed  were discussed with the patient per Dr. Stefano Gaul.  The patient  instructions were gone over with her and 2 other female family members in  the room.  The patient did verbalize understanding of instructions.  The  patient was given a sheet to record her blood sugars, which she is to  continue to check each morning fasting as well as 2 hours after her  meals.      Candice Denny Levy, [place]      Janine Limbo, M.D.  Electronically Signed    CHS/MEDQ  D:  10/09/2008  T:  10/10/2008  Job:  [id number]

## 2010-10-08 NOTE — Op Note (Signed)
Zoe Morris, Zoe Morris            ACCOUNT NO.:  [id number]   MEDICAL RECORD NO.:  [id number]          PATIENT TYPE:  INP   LOCATION:  9157                          [facility]ACILITY:  [facility]   PHYSICIAN:  Hal Morales, M.D.DATE OF BIRTH:  1975-10-18   DATE OF PROCEDURE:  09/16/2008  DATE OF DISCHARGE:                               OPERATIVE REPORT   PREOPERATIVE DIAGNOSES:  Intrauterine pregnancy at 21 weeks and 5 days;  cervical shortening, probable cervical incompetence; history of preterm  delivery x3; uterine fibroids.   POSTOPERATIVE DIAGNOSES:  Intrauterine pregnancy at 21 weeks and 5 days;  cervical shortening, probable cervical incompetence; history of preterm  delivery x3; uterine fibroids.   OPERATION:  Rescue cervical cerclage.   SURGEON:  Hal Morales, MD   ANESTHESIA:  Spinal.   ESTIMATED BLOOD LOSS:  Less than 50 mL.   COMPLICATIONS:  None.   FINDINGS:  The cervix was approximately 2 cm long and approximately 2 cm  dilated.   PROCEDURE IN DETAIL:  The patient was taken to the operating room after  appropriate identification and placed on the operating table.  After the  placement of a spinal anesthetic, she was placed in the supine position  and then the lithotomy position.  The perineum and vagina were prepped  with multiple layers of Betadine and draped as a sterile field.  A  weighted speculum was placed in the posterior vagina.  The patient had  been given Ancef 2 g IV for surgical prophylaxis.  The anterior vagina  was then elevated with a deeper retractor and Deschamps clamps used to  grasp the cervix.  The length of the cervix was as noted.  A transverse  incision at the cervicovaginal junction was made and the bladder  dissected off the anterior cervix to what was considered the level of  the internal os.  A suture 5-mm Mersilene was then placed at that level  in a purse-string fashion and tied posteriorly on the cervix.  It should  be noted that the  cervicovaginal junction posteriorly was much shorter  than anteriorly.  Once the cerclage was tied down, there was less than 1  cm opening in the cervix.  The vaginal mucosa that had been incised and  dissected off the anterior cervix was then repaired with a running  suture of 3-0 Vicryl.  Hemostasis was noted to be adequate.  All  instruments were removed from the vagina and a douche of 150 mg of  clindamycin and 30 mg of saline were placed in the vagina.  The patient  was then taken from the operating room to the recovery room in  satisfactory condition having tolerated the procedure well with sponge  and instrument counts correct.       Hal Morales, M.D.  Electronically Signed     VPH/MEDQ  D:  09/16/2008  T:  09/16/2008  Job:  [id number]

## 2010-10-08 NOTE — H&P (Signed)
[name], [name]            ACCOUNT NO.:  [id number]   MEDICAL RECORD NO.:  [id number]         PATIENT TYPE:  WINP   LOCATION:                                FACILITY:  [facility]   PHYSICIAN:  Zoe Morris, M.D.DATE OF BIRTH:  06-28-1975   DATE OF ADMISSION:  09/15/2008  DATE OF DISCHARGE:                              HISTORY & PHYSICAL   Zoe Morris is a 35 year old gravida 8, para 0-3-4-3 at 21.4 weeks who  was sent over for admission by maternal fetal medicine, Dr. Katrinka Blazing, for  concerns of a shortening cervix with funneling and history of preterm  delivery x3 at 26, 27 and 28 weeks.  Zoe Morris is followed by the  doctors at [facility].  Her pregnancy is remarkable for  history of PPROM x2 with the first two babies, history of preterm  delivery x3, a history of cardiac problems and a cardiac evaluation  including palpitations and irregular rhythm of her heart rate, elevated  glucose at her prenatal visit yesterday, history of Trichomonas, late to  care, childhood history of asthma, history of abnormal Pap and  cryotherapy in 1992, septate uterus.  Zoe Morris prenatal labs  include an initial hemoglobin of 11.5, hematocrit 34.2, platelets 243,  blood type A+, antibody screen negative, sickle cell trait negative,  rubella immune, RPR nonreactive, hepatitis B negative, HIV nonreactive,  quad screen negative, normal Pap, negative gonorrhea and Chlamydia.  She  has had BV and a UTI and she had a capillary blood glucose of 181  yesterday in the office.   HISTORY OF PRESENT PREGNANCY:  Zoe Morris began prenatal care at 16.5  weeks with an OB interview, at 18.2 weeks she had her first visit  concurrent with her anatomy ultrasound.  At that time she was given some  Flagyl for bacterial vaginosis and a quad screen was done which came  back normal and 17P was begun per her history and she was sent over to  maternal fetal medicine today for consult recommended by Dr.  Pennie Rushing and  [facility] to antenatal direct from the office for  concerns of her cervix and evaluation for cerclage placement in the  morning which patient is not completely convinced she needs.   OB HISTORY:  Zoe Morris has been pregnant 8 times including this  pregnancy.  Gravida #1:  A son born in October 1991 at 26 weeks, he  weighed 2 pounds 3 ounces.  Gravida #2:  Another son born in April 1995  at 27 weeks, weighing 2 pounds 8 ounces.  Both gravida 1 and 2 were  prolonged premature rupture of membranes.  Gravida #3:  A daughter born  in May 2001 at 28 weeks, weighing 2 pounds 10 ounces, she did not have  prolonged premature rupture of membranes it was just a preterm delivery.  Gravida #4:  A termination in 2002.  Gravida #5:  A termination in 2006.  Gravida #6:  A termination in 2008.  Gravida #7:  A termination in 2009.  Gravida #8 current pregnancy.   ALLERGIES:  Zoe Morris IS NOT ALLERGIC  TO MEDICATIONS OR FOOD OR LATEX.   MEDICAL HISTORY:  Zoe Morris has a history of prolonged premature  rupture of membranes x2 pregnancies and preterm delivery x3 pregnancies.  She has used birth control in the past including the patch and  Femcon.  She had an abnormal Pap and cryo in 1992.  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 She had Trichomonas in March of this  year, she had the usual childhood illnesses including chickenpox.  She  had childhood asthma, has not needed an inhaler for more than 20 years.  She has palpitations and an arrhythmia and has been followed by Dr.  Algie Coffer and evaluated a year ago but did not go back for all her follow-  up visits.  She has varicose veins.   FAMILY HISTORY:  Paternal grandmother who has pacemaker and paternal  grandmother and mother, paternal uncle and paternal grandfather all have  chronic hypertension.  Maternal grandmother has varicose veins.  Sister  has anemia.  Paternal grandfather, paternal uncle  and cousins with  diabetes.   SURGICAL HISTORY:  The patient had tonsils removed at age 35.   GENETIC HISTORY:  Father of the baby has an extra digit, her second  child has an extra digit.  Father of the baby's grandmother has muscular  dystrophy.  The patient has a negative sickle cell screen and a negative  quad screen.   SOCIAL HISTORY:  Zoe Morris is single, African American, she does not  state a religion.  Father of the baby's name is Tiburcio Bash Little.  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  Father of the baby has  a high school diploma.  The patient denies any use of steroids, alcohol  or street drugs during the pregnancy.   VITAL SIGNS:  Today include temperature 98.7, pulse 81, respiratory rate  18, blood pressure 105/55.  Fetal heart rate is 150.  The patient is alert and oriented, conversing appropriately, no CNS  changes, no complaints other than a sore throat related to allergies.  She has no edema of her extremities.  LUNGS:  Clear to auscultation and she does have an audible arrhythmia in  her heart that is irregular but the rate is normal.  ABDOMEN:  Soft and nontender.  Her uterus is gravid and appropriate for  gestational age at the umbilicus, soft to palpation.  No contractions  tracing on the toco.  Normal DTRs.  No leaking or bleeding from the vagina.   IMPRESSION:  A. 35-year gravida 8, para 0-3-4-3 at 21.4 weeks, septate  uterus, history of preterm delivery x3, short funneling cervix per  ultrasound evaluation, Dr. Katrinka Blazing.  Reassuring fetal status no active  contractions or vaginal leaking, irregular heart rhythm, elevated blood  glucose.   PLAN:  Routine antenatal orders, GBS culture, bedrest.  Dr. Pennie Rushing to follow and review options for the patient including  cerclage  Continue 17-P      Zoe Morris, CNM      Zoe Morris, M.D.  Electronically Signed    JM/MEDQ  D:  09/15/2008  T:  09/15/2008  Job:  [id number]

## 2011-08-15 ENCOUNTER — Encounter (HOSPITAL_COMMUNITY): Payer: Self-pay

## 2011-08-15 ENCOUNTER — Emergency Department (INDEPENDENT_AMBULATORY_CARE_PROVIDER_SITE_OTHER)
Admission: EM | Admit: 2011-08-15 | Discharge: 2011-08-15 | Disposition: A | Discharge status: Home / Self Care | Payer: Self-pay | Source: Home / Self Care | Attending: Emergency Medicine | Admitting: Emergency Medicine

## 2011-08-15 DIAGNOSIS — J40 Bronchitis, not specified as acute or chronic: Secondary | ICD-10-CM

## 2011-08-15 MED ORDER — DOXYCYCLINE HYCLATE 100 MG PO CAPS: 100.0000 mg | ORAL_CAPSULE | Freq: Two times a day (BID) | ORAL | Status: AC

## 2011-08-15 MED ORDER — GUAIFENESIN-CODEINE 100-10 MG/5ML PO SYRP: 5.0000 mL | ORAL_SOLUTION | Freq: Three times a day (TID) | ORAL | Status: AC | PRN

## 2011-08-15 NOTE — ED Provider Notes (Signed)
History     CSN: [id number]  Arrival date & time 08/15/11  1127   First MD Initiated Contact with Patient 08/15/11 1241      Chief Complaint  Patient presents with  . Cough    (Consider location/radiation/quality/duration/timing/severity/associated sxs/prior treatment) HPI Comments: Patient presents urgent care complaining of cough and congestion for about 2 weeks started with a cold within the last week she has no noticed that her cough has become productive with green to yellow sputum coughing more frequently denies any recent fevers or shortness of breath. Has been trying to control her cough with over-the-counter medicines.  Patient denies any gastrointestinal symptoms.  Patient is a 36 y.o. female presenting with cough. The history is provided by the patient.  Cough This is a new problem. The current episode started more than 1 week ago. The problem occurs constantly. The problem has been gradually worsening. The cough is productive of sputum. There has been no fever. Associated symptoms include headaches and rhinorrhea. Pertinent negatives include no chest pain, no chills, no myalgias, no shortness of breath and no wheezing. She has tried decongestants for the symptoms. She is not a smoker. Her past medical history does not include asthma.    History reviewed. No pertinent past medical history.  History reviewed. No pertinent past surgical history.  History reviewed. No pertinent family history.  History  Substance Use Topics  . Smoking status: Not on file  . Smokeless tobacco: Not on file  . Alcohol Use: Not on file    OB History    Grav Para Term Preterm Abortions TAB SAB Ect Mult Living                  Review of Systems  Constitutional: Negative for fever, chills and fatigue.  HENT: Positive for rhinorrhea.   Respiratory: Positive for cough. Negative for chest tightness, shortness of breath, wheezing and stridor.   Cardiovascular: Negative for chest pain.    Musculoskeletal: Negative for myalgias.  Neurological: Positive for headaches.    Allergies  Review of patient's allergies indicates no known allergies.  Home Medications   Current Outpatient Rx  Name Route Sig Dispense Refill  . DOXYCYCLINE HYCLATE 100 MG PO CAPS Oral Take 1 capsule (100 mg total) by mouth 2 (two) times daily. 20 capsule 0  . GUAIFENESIN-CODEINE 100-10 MG/5ML PO SYRP Oral Take 5 mLs by mouth 3 (three) times daily as needed for cough. 120 mL 0    BP 111/72  Pulse 85  Temp(Src) 98.2 F (36.8 C) (Oral)  Resp 16  SpO2 100%  LMP 07/24/2011  Physical Exam  Nursing note and vitals reviewed. Constitutional: She appears well-developed and well-nourished. No distress.  HENT:  Head: Normocephalic.  Right Ear: Tympanic membrane normal.  Left Ear: Tympanic membrane normal.  Nose: Nose normal.  Mouth/Throat: Uvula is midline. Posterior oropharyngeal erythema present.  Pulmonary/Chest: Effort normal. No respiratory distress. She has no decreased breath sounds. She has no wheezes. She has rhonchi. She has no rales. She exhibits no tenderness.  Skin: No rash noted.    ED Course  Procedures (including critical care time)  Labs Reviewed - No data to display No results found.   1. Bronchitis     Patient with a productive cough and respiratory symptoms for 3 weeks have encouraged patient to return if cough or congestion doesn't improve in the next 2-3 days to consider an x-ray. At this point patient is afebrile oxygenating well non-tachycardic and looks comfortable.  Jimmie Molly, MD 08/15/11 1326

## 2011-08-15 NOTE — ED Notes (Signed)
3 week duration of HA, productive cough (green-yellow secretions) , body aches

## 2011-08-15 NOTE — Discharge Instructions (Signed)
  As discussed return if no improvement within 2-3 days for a second lung exam. Start with antibiotics today and drink abundant fluids   Bronchitis Bronchitis is a problem of the air tubes leading to your lungs. This problem makes it hard for air to get in and out of the lungs. You may cough a lot because your air tubes are narrow. Going without care can cause lasting (chronic) bronchitis. HOME CARE   Drink enough fluids to keep your pee (urine) clear or pale yellow.   Use a cool mist humidifier.   Quit smoking if you smoke. If you keep smoking, the bronchitis might not get better.   Only take medicine as told by your doctor.  GET HELP RIGHT AWAY IF:   Coughing keeps you awake.   You start to wheeze.   You become more sick or weak.   You have a hard time breathing or get short of breath.   You cough up blood.   Coughing lasts more than 2 weeks.   You have a fever.   Your baby is older than 3 months with a rectal temperature of 102 F (38.9 C) or higher.   Your baby is 33 months old or younger with a rectal temperature of 100.4 F (38 C) or higher.  MAKE SURE YOU:  Understand these instructions.   Will watch your condition.   Will get help right away if you are not doing well or get worse.  Document Released: 10/29/2007 Document Revised: 05/01/2011 Document Reviewed: 04/13/2009 [facility] Patient Information 2012 [place], [place].Bronchitis Bronchitis is a problem of the air tubes leading to your lungs. This problem makes it hard for air to get in and out of the lungs. You may cough a lot because your air tubes are narrow. Going without care can cause lasting (chronic) bronchitis. HOME CARE   Drink enough fluids to keep your pee (urine) clear or pale yellow.   Use a cool mist humidifier.   Quit smoking if you smoke. If you keep smoking, the bronchitis might not get better.   Only take medicine as told by your doctor.  GET HELP RIGHT AWAY IF:   Coughing keeps you  awake.   You start to wheeze.   You become more sick or weak.   You have a hard time breathing or get short of breath.   You cough up blood.   Coughing lasts more than 2 weeks.   You have a fever.   Your baby is older than 3 months with a rectal temperature of 102 F (38.9 C) or higher.   Your baby is 92 months old or younger with a rectal temperature of 100.4 F (38 C) or higher.  MAKE SURE YOU:  Understand these instructions.   Will watch your condition.   Will get help right away if you are not doing well or get worse.  Document Released: 10/29/2007 Document Revised: 05/01/2011 Document Reviewed: 04/13/2009 [facility] Patient Information 2012 [place], [place].

## 2011-09-23 ENCOUNTER — Encounter (HOSPITAL_COMMUNITY): Payer: Self-pay | Admitting: *Deleted

## 2011-09-23 ENCOUNTER — Emergency Department (HOSPITAL_COMMUNITY): Payer: Self-pay

## 2011-09-23 ENCOUNTER — Emergency Department (HOSPITAL_COMMUNITY)
Admission: EM | Admit: 2011-09-23 | Discharge: 2011-09-23 | Disposition: A | Discharge status: Home / Self Care | Payer: Self-pay | Attending: Emergency Medicine | Admitting: Emergency Medicine

## 2011-09-23 DIAGNOSIS — J45909 Unspecified asthma, uncomplicated: Secondary | ICD-10-CM | POA: Insufficient documentation

## 2011-09-23 DIAGNOSIS — R05 Cough: Secondary | ICD-10-CM | POA: Insufficient documentation

## 2011-09-23 DIAGNOSIS — R059 Cough, unspecified: Secondary | ICD-10-CM | POA: Insufficient documentation

## 2011-09-23 MED ORDER — PREDNISONE (PAK) 10 MG PO TABS: 10.0000 mg | ORAL_TABLET | Freq: Every day | ORAL | Status: AC

## 2011-09-23 MED ORDER — HYDROCODONE-ACETAMINOPHEN 7.5-500 MG/15ML PO SOLN: 15.0000 mL | Freq: Four times a day (QID) | ORAL | Status: AC | PRN

## 2011-09-23 MED ORDER — ALBUTEROL SULFATE HFA 108 (90 BASE) MCG/ACT IN AERS
2.0000 | INHALATION_SPRAY | RESPIRATORY_TRACT | Status: DC | PRN
  Administered 2011-09-23: 2 via RESPIRATORY_TRACT
  Filled 2011-09-23: qty 6.7

## 2011-09-23 NOTE — ED Notes (Signed)
Pt. With URI for seven weeks, went to [facility] and was given doxycycline for her symptoms of wheezing and congestion.  Pt. Finished antibiotic but reports she doesn't feel much better.  Still wheezing, and did not receive an inhaler from urgent care.  Pt. Has history of asthma.

## 2011-09-23 NOTE — Discharge Instructions (Signed)
Cough, Adult  A cough is a reflex that helps clear your throat and airways. It can help heal the body or may be a reaction to an irritated airway. A cough may only last 2 or 3 weeks (acute) or may last more than 8 weeks (chronic).  CAUSES Acute cough:  Viral or bacterial infections.  Chronic cough:  Infections.   Allergies.   Asthma.   Post-nasal drip.   Smoking.   Heartburn or acid reflux.   Some medicines.   Chronic lung problems (COPD).   Cancer.  SYMPTOMS   Cough.   Fever.   Chest pain.   Increased breathing rate.   High-pitched whistling sound when breathing (wheezing).   Colored mucus that you cough up (sputum).  TREATMENT   A bacterial cough may be treated with antibiotic medicine.   A viral cough must run its course and will not respond to antibiotics.   Your caregiver may recommend other treatments if you have a chronic cough.  HOME CARE INSTRUCTIONS   Only take over-the-counter or prescription medicines for pain, discomfort, or fever as directed by your caregiver. Use cough suppressants only as directed by your caregiver.   Use a cold steam vaporizer or humidifier in your bedroom or home to help loosen secretions.   Sleep in a semi-upright position if your cough is worse at night.   Rest as needed.   Stop smoking if you smoke.  SEEK IMMEDIATE MEDICAL CARE IF:   You have pus in your sputum.   Your cough starts to worsen.   You cannot control your cough with suppressants and are losing sleep.   You begin coughing up blood.   You have difficulty breathing.   You develop pain which is getting worse or is uncontrolled with medicine.   You have a fever.  MAKE SURE YOU:   Understand these instructions.   Will watch your condition.   Will get help right away if you are not doing well or get worse.  Document Released: 11/08/2010 Document Revised: 05/01/2011 Document Reviewed: 11/08/2010 [facility] Patient Information 2012 [place],  [place].  Asthma FAQ Asthma is a serious condition that causes breathing problems. Some severe attacks can cause death. Wear a bracelet or chain that lets others know you have asthma. HOW DID I GET ASTHMA? You might have been born with asthma, or you might be allergic to something that causes an asthma attack. WHAT CAUSES AN ASTHMA ATTACK? There are many things that can cause an asthma attack. Some of the most common causes are:  Grass, weed, or tree pollen in the air.   Air pollution.   Dust.   Heavy or hard exercise.   Emotional upset.   Infections.   Smoking and secondhand smoke.   Some medicines like aspirin.   Allergies to:   Animals such as cats, dogs, or rabbits.   Certain foods like wheat, rye, nuts, or shellfish.  HOW DO I KEEP FROM HAVING AN ATTACK?  Refill your medicines before they run out.   Always take your medicine like the doctor tells you. This will help prevent an asthma attack.   If you use an inhaler or diskus, carry it with you at all times.   Stay indoors as much as possible on ozone alert days. This is when the weather is very cold and when the pollen count is high.   Talk to your nurse or doctor about relaxation techniques that might help.   Stop smoking and stay away from  smoke.  WHAT HAPPENS DURING AN ASTHMA ATTACK? The airways in your lungs get smaller and puff up (swell). You:  Have a hard time breathing.   Wheeze.   Cough and produce a lot of mucus.  HOW DO I STOP AN ATTACK?  Take medication as directed by your doctor.   If your medicine is not helping, call your local emergency medical services, and go to the emergency room.  Document Released: 02/19/2008 Document Revised: 05/01/2011 Document Reviewed: 02/19/2008 [facility] Patient Information 2012 [place], [place].

## 2011-09-23 NOTE — ED Provider Notes (Signed)
History     CSN: [id number]  Arrival date & time 09/23/11  1336   First MD Initiated Contact with Patient 09/23/11 1633      Chief Complaint  Patient presents with  . URI    (Consider location/radiation/quality/duration/timing/severity/associated sxs/prior treatment) HPI  36 year old female with history of asthma presents with chief complaints of cough and wheezing. Patient states for the past several months she has been having persistent cough. Onset is gradual, persistent, and symptoms include nasal congestion, and chest congestion, with nonproductive cough. Patient also has been wheezing but that has improved. She denies fever, chills, runny nose, earache, sore throat, dyspnea on exertion, calf pain, or leg swelling, or rash. Patient was seen at urgent care 4 weeks ago and was given doxycycline and cough medication. Patient state antibiotic did not help at all. Her cough medication has helped with sleep.  No recent environmental changes, no new medications, no prior hx of intubation or hospitalization for asthma.  Is a smoker and smoke 1-2 cigarette a day.   History reviewed. No pertinent past medical history.  History reviewed. No pertinent past surgical history.  History reviewed. No pertinent family history.  History  Substance Use Topics  . Smoking status: Current Everyday Smoker  . Smokeless tobacco: Not on file  . Alcohol Use: No    OB History    Grav Para Term Preterm Abortions TAB SAB Ect Mult Living                  Review of Systems  All other systems reviewed and are negative.    Allergies  Review of patient's allergies indicates no known allergies.  Home Medications   Current Outpatient Rx  Name Route Sig Dispense Refill  . DOXYCYCLINE HYCLATE 100 MG PO TABS Oral Take 100 mg by mouth 2 (two) times daily. Completed course 2 weeks ago    . GUAIFENESIN-CODEINE 100-10 MG/5ML PO SYRP Oral Take 5 mLs by mouth 3 (three) times daily as needed. Completed  course 2 weeks ago      BP 112/68  Pulse 87  Resp 20  SpO2 98%  LMP 08/25/2011  Physical Exam  Nursing note and vitals reviewed. Constitutional: She appears well-developed and well-nourished. No distress.       Awake, alert, nontoxic appearance  HENT:  Head: Atraumatic.  Right Ear: External ear normal.  Left Ear: External ear normal.  Nose: Nose normal.  Mouth/Throat: Oropharynx is clear and moist. No oropharyngeal exudate.  Eyes: Conjunctivae are normal. Right eye exhibits no discharge. Left eye exhibits no discharge.  Neck: Neck supple.  Cardiovascular: Normal rate and regular rhythm.   Pulmonary/Chest: Effort normal. No respiratory distress. She has no wheezes. She has no rales. She exhibits no tenderness.  Abdominal: Soft. There is no tenderness. There is no rebound.  Musculoskeletal: She exhibits no edema and no tenderness.       ROM appears intact, no obvious focal weakness  Lymphadenopathy:    She has no cervical adenopathy.  Neurological: She is alert.       Mental status and motor strength appears intact  Skin: No rash noted.  Psychiatric: She has a normal mood and affect.    ED Course  Procedures (including critical care time)  Labs Reviewed - No data to display Dg Chest 2 View  09/23/2011  *RADIOLOGY REPORT*  Clinical Data: Cough, wheezing, smoker, URI  CHEST - 2 VIEW  Comparison: None  Findings: Normal heart size, mediastinal contours, and pulmonary vascularity. Lungs  clear. Bones unremarkable. No pneumothorax.  IMPRESSION: Normal exam.  Original Report Authenticated By: Lollie Marrow, M.D.     No diagnosis found.    MDM  Patient with persistent coughing and wheezing for 7 weeks. She is in no acute respiratory distress. Her chest x-ray is unremarkable. Her examination is unremarkable. However, due to the duration patient will be prescribed inhaler to use as needed, cough medication, and a steroid taper course.        Fayrene Helper, PA-C 09/23/11  1649

## 2011-09-23 NOTE — ED Notes (Signed)
Pt in c/o cough and wheezing x7 weeks, dx with bronchitis and given antibiotics

## 2011-09-24 NOTE — ED Provider Notes (Signed)
Medical screening examination/treatment/procedure(s) were performed by non-physician practitioner and as supervising physician I was immediately available for consultation/collaboration.  Litha Lamartina T Uno Esau, MD 09/24/11 2330 

## 2012-06-26 IMAGING — CR DG CHEST 2V
2 series · 2 of 2 positions shown · non-contrast
Comparison: None

CLINICAL DATA: Cough, wheezing, smoker, URI

CHEST - 2 VIEW

[w chest pa]
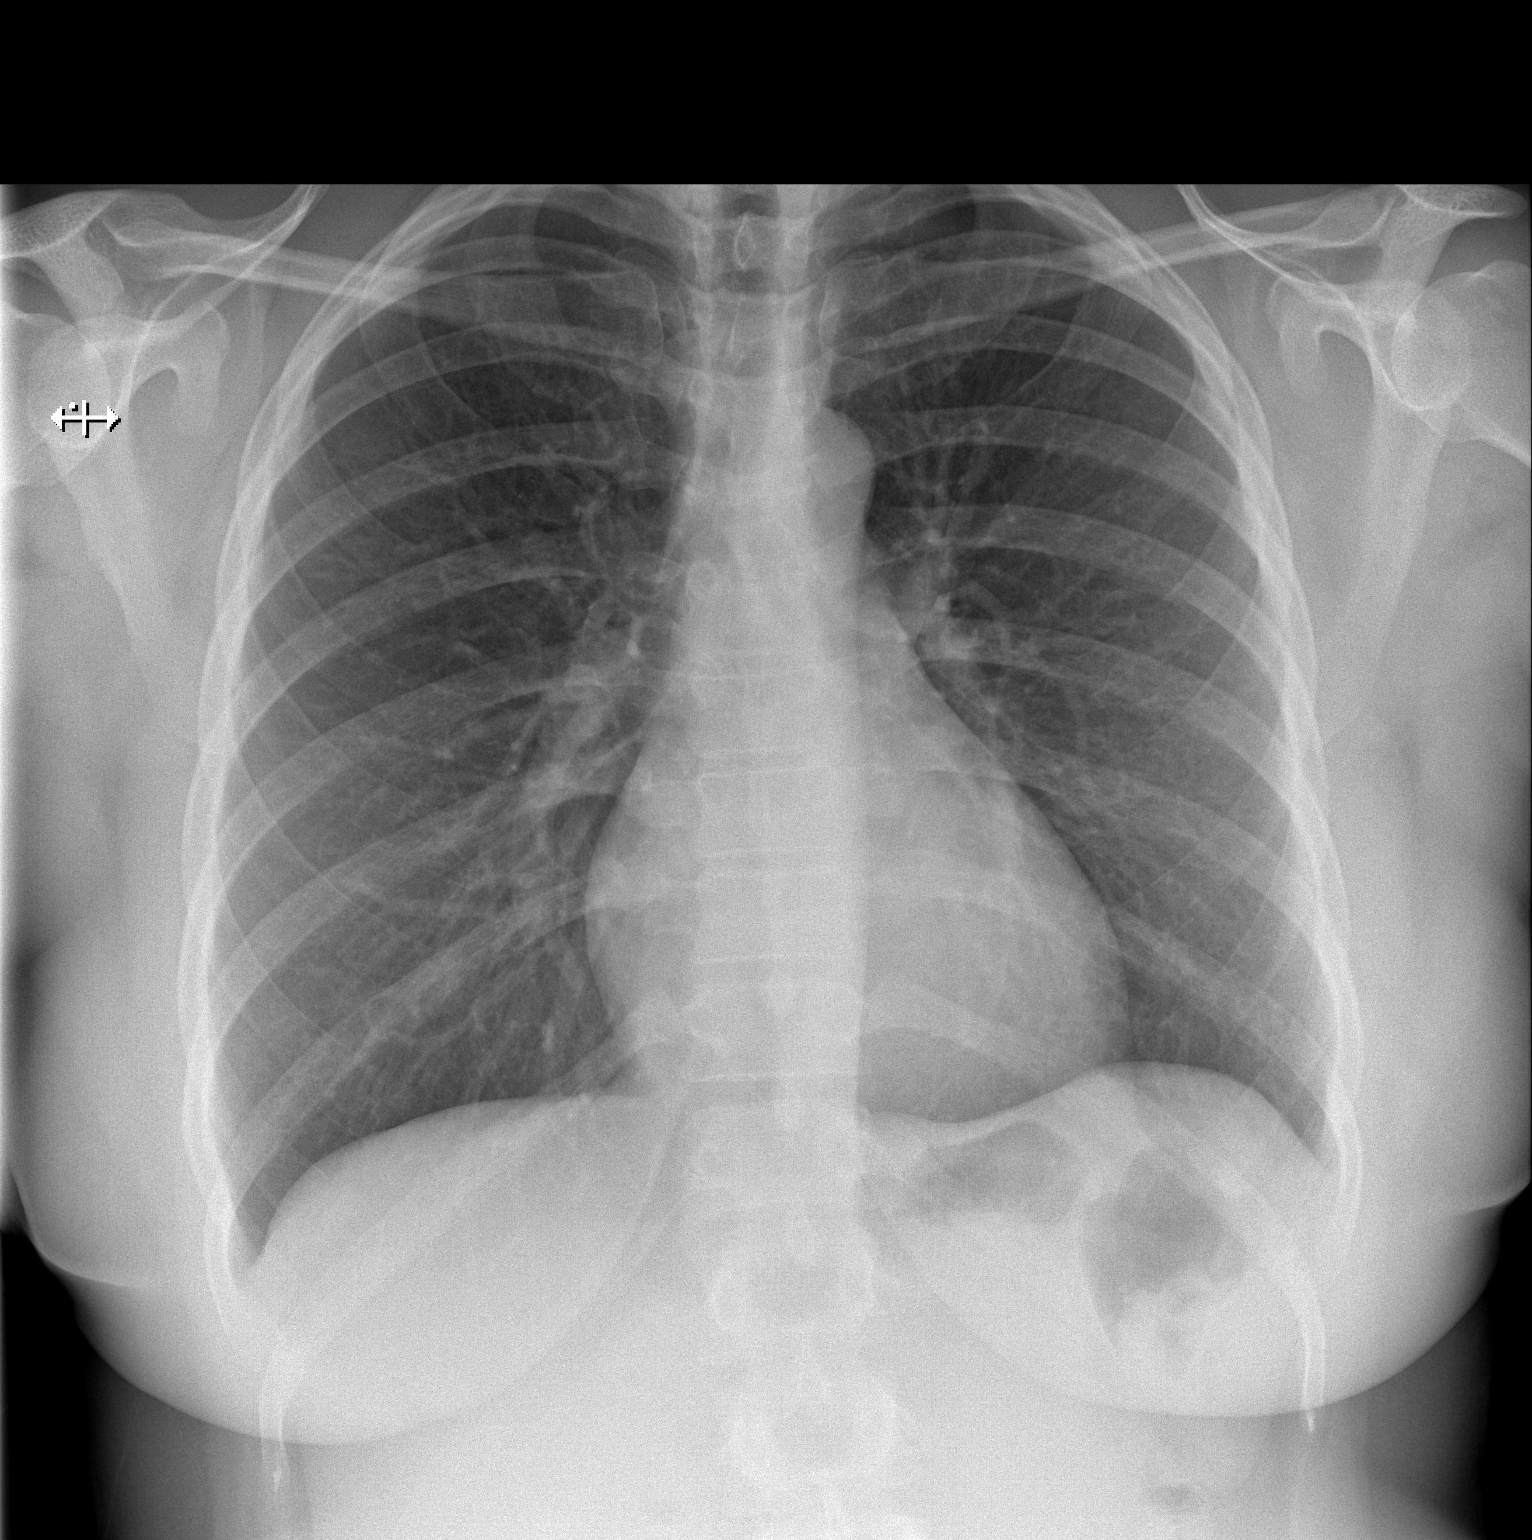

[w chest lat]
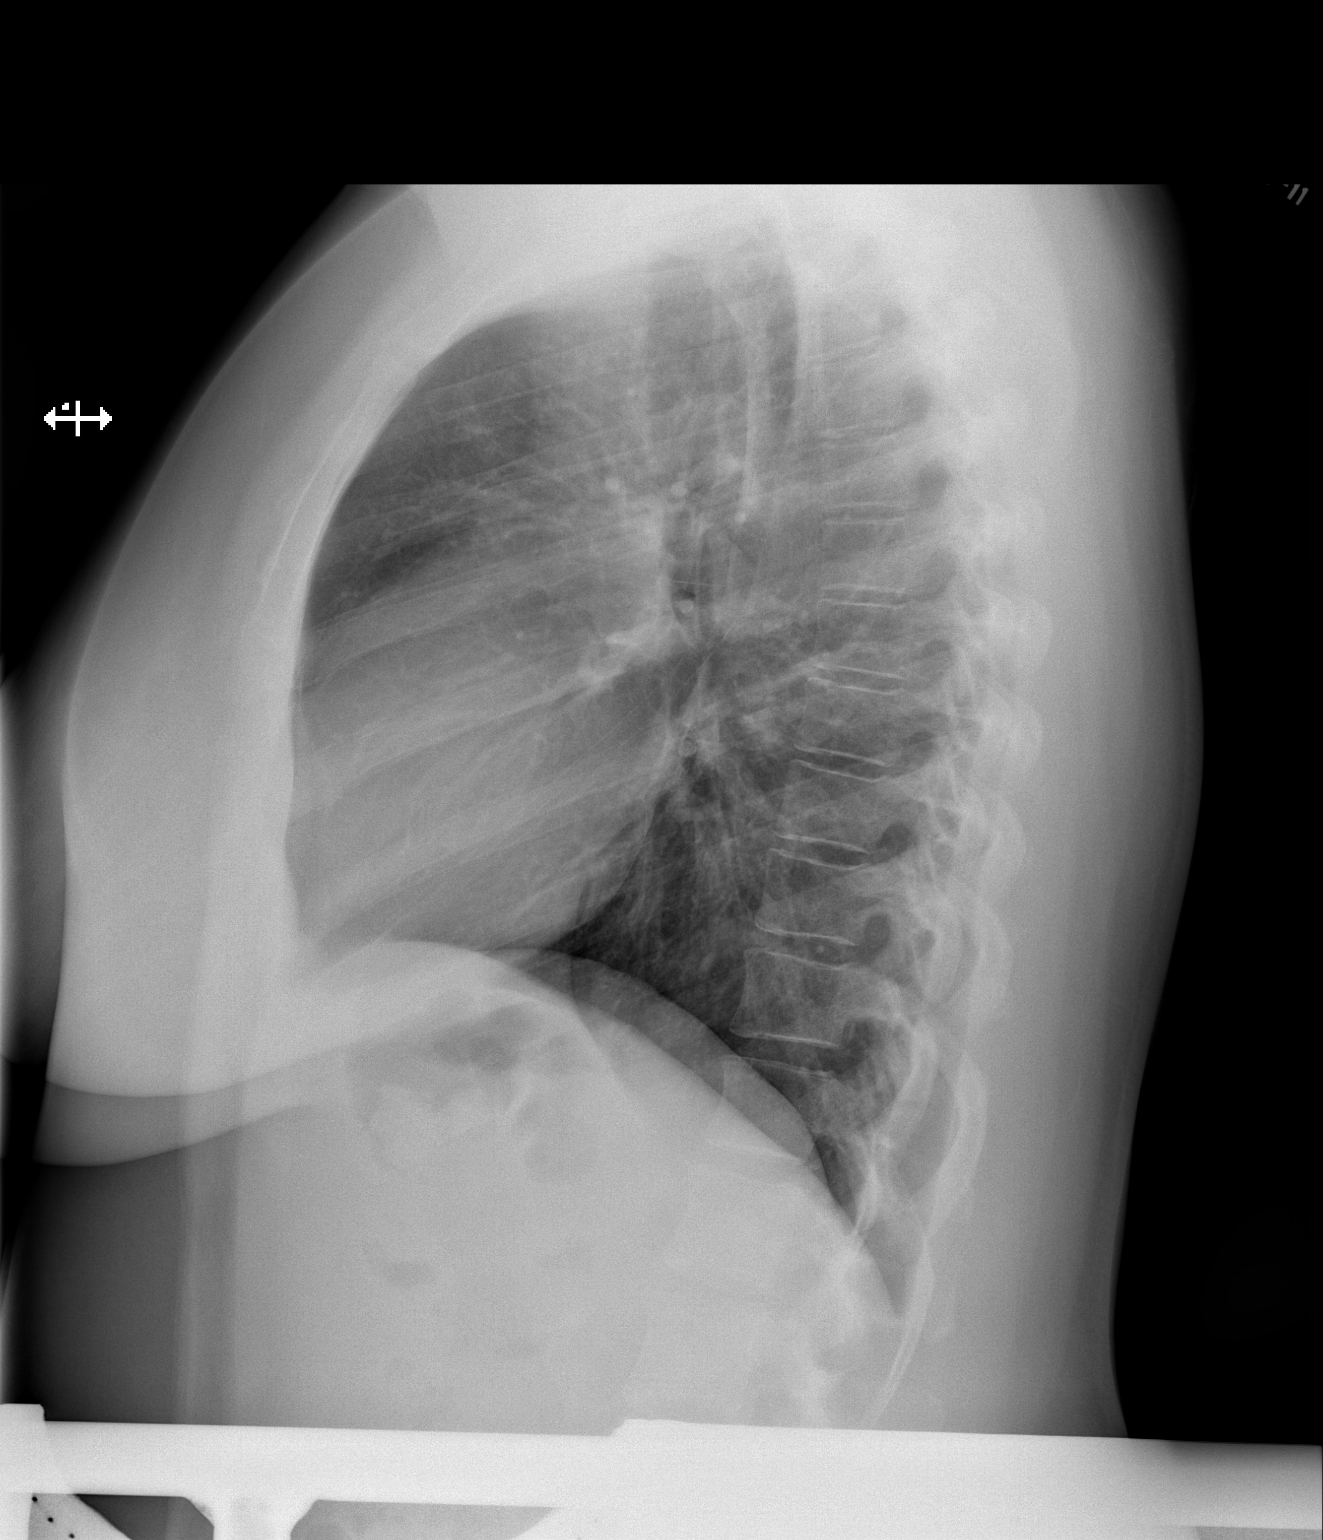

[2 of 2 positions shown; findings below may reference images not displayed]

FINDINGS: Normal heart size, mediastinal contours, and pulmonary vascularity.
Lungs clear.
Bones unremarkable.
No pneumothorax.
IMPRESSION: Normal exam.

## 2015-04-02 ENCOUNTER — Encounter (HOSPITAL_COMMUNITY): Payer: Self-pay | Admitting: Nurse Practitioner

## 2015-04-02 ENCOUNTER — Emergency Department (HOSPITAL_COMMUNITY)
Admission: EM | Admit: 2015-04-02 | Discharge: 2015-04-02 | Disposition: A | Discharge status: Home / Self Care | Payer: 59 | Attending: Emergency Medicine | Admitting: Emergency Medicine

## 2015-04-02 DIAGNOSIS — Z720 Tobacco use: Secondary | ICD-10-CM | POA: Diagnosis not present

## 2015-04-02 DIAGNOSIS — R600 Localized edema: Secondary | ICD-10-CM | POA: Insufficient documentation

## 2015-04-02 DIAGNOSIS — R2243 Localized swelling, mass and lump, lower limb, bilateral: Secondary | ICD-10-CM | POA: Diagnosis present

## 2015-04-02 DIAGNOSIS — R609 Edema, unspecified: Secondary | ICD-10-CM

## 2015-04-02 LAB — I-STAT CHEM 8, ED
BUN: 11 mg/dL (ref 6–20)
Calcium, Ion: 1.1 mmol/L — ABNORMAL LOW (ref 1.12–1.23)
Chloride: 104 mmol/L (ref 101–111)
Creatinine, Ser: 1.1 mg/dL — ABNORMAL HIGH (ref 0.44–1.00)
Glucose, Bld: 101 mg/dL — ABNORMAL HIGH (ref 65–99)
HCT: 39 % (ref 36.0–46.0)
Hemoglobin: 13.3 g/dL (ref 12.0–15.0)
Potassium: 3.6 mmol/L (ref 3.5–5.1)
Sodium: 140 mmol/L (ref 135–145)
TCO2: 23 mmol/L (ref 0–100)

## 2015-04-02 MED ORDER — FUROSEMIDE 40 MG PO TABS
20.0000 mg | ORAL_TABLET | Freq: Once | ORAL | Status: AC
  Administered 2015-04-02: 20 mg via ORAL
  Filled 2015-04-02: qty 1

## 2015-04-02 NOTE — Discharge Instructions (Signed)

## 2015-04-02 NOTE — ED Notes (Signed)
Pt c/o bilateral leg pain with recent swelling. Denies any other symptoms.

## 2015-04-02 NOTE — ED Notes (Addendum)
Pt reports taking Cytotec for an abortion on 03/24/15 and is concerned this may be the cause of the b/l LE edema.

## 2015-04-02 NOTE — ED Provider Notes (Signed)
CSN: [id number]     Arrival date & time 04/02/15  0041 History   First MD Initiated Contact with Patient 04/02/15 0131     Chief Complaint  Patient presents with  . Leg Swelling    Bilateral     (Consider location/radiation/quality/duration/timing/severity/associated sxs/prior Treatment) HPI Comments: The patient reports she is having swelling of her lower legs, increasing over the last week. No SOB, or chest pain. No history of hypertension or previous leg swelling. The symptoms are worse throughout the day while on her feet and get better with rest or with overnight elevation. No discoloration, no fever.  The history is provided by the patient. No language interpreter was used.    History reviewed. No pertinent past medical history. History reviewed. No pertinent past surgical history. History reviewed. No pertinent family history. Social History  Substance Use Topics  . Smoking status: Current Every Day Smoker  . Smokeless tobacco: None  . Alcohol Use: No   OB History    No data available     Review of Systems  Constitutional: Negative for fever and chills.  Respiratory: Negative.  Negative for shortness of breath.   Cardiovascular: Positive for leg swelling. Negative for chest pain.  Gastrointestinal: Negative.  Negative for nausea.  Musculoskeletal: Negative.  Negative for myalgias.  Skin: Negative.  Negative for color change.  Neurological: Negative.       Allergies  Review of patient's allergies indicates no known allergies.  Home Medications   Prior to Admission medications   Medication Sig Start Date End Date Taking? Authorizing Provider  doxycycline (VIBRA-TABS) 100 MG tablet Take 100 mg by mouth 2 (two) times daily. Completed course 2 weeks ago    Historical Provider, MD  guaiFENesin-codeine (ROBITUSSIN AC) 100-10 MG/5ML syrup Take 5 mLs by mouth 3 (three) times daily as needed. Completed course 2 weeks ago    Historical Provider, MD   BP 126/85 mmHg   Pulse 79  Temp(Src) 98.1 F (36.7 C) (Oral)  Resp 14  SpO2 100%  LMP 02/08/2015 (Approximate) Physical Exam  Constitutional: She is oriented to person, place, and time. She appears well-developed and well-nourished.  Neck: Normal range of motion.  Pulmonary/Chest: Effort normal.  Musculoskeletal:  Mild bilateral LE edema. No discoloration or focal tenderness. FROM all joints of the lower extremities. Pulses are 2+.  Neurological: She is alert and oriented to person, place, and time.  Skin: Skin is warm and dry.    ED Course  Procedures (including critical care time) Labs Review Labs Reviewed  I-STAT CHEM 8, ED - Abnormal; Notable for the following:    Creatinine, Ser 1.10 (*)    Glucose, Bld 101 (*)    Calcium, Ion 1.10 (*)    All other components within normal limits    Imaging Review No results found. I have personally reviewed and evaluated these images and lab results as part of my medical decision-making.   EKG Interpretation None      MDM   Final diagnoses:  None    1. LE edema  Edema is very mild. No concerning abnormalities on lab evaluation, minimally elevated creatinine. VSS, no HTN. Will treat with single dose Lasix and have her follow up with her primary care Doylene Canard) for further management. She admits to concern for symptoms being caused by her recent use of Cytotec. This will be addressed with PCP.    Charlann Lange, PA-C 04/02/15 2671  Julianne Rice, MD 04/02/15 [phone]

## 2015-06-06 DIAGNOSIS — R300 Dysuria: Secondary | ICD-10-CM | POA: Diagnosis not present

## 2015-06-06 DIAGNOSIS — R3915 Urgency of urination: Secondary | ICD-10-CM | POA: Diagnosis not present

## 2015-06-06 DIAGNOSIS — R350 Frequency of micturition: Secondary | ICD-10-CM | POA: Diagnosis not present

## 2015-06-06 DIAGNOSIS — Z3049 Encounter for surveillance of other contraceptives: Secondary | ICD-10-CM | POA: Diagnosis not present

## 2015-06-06 DIAGNOSIS — R8290 Unspecified abnormal findings in urine: Secondary | ICD-10-CM | POA: Diagnosis not present

## 2015-06-06 DIAGNOSIS — Z3009 Encounter for other general counseling and advice on contraception: Secondary | ICD-10-CM | POA: Diagnosis not present

## 2015-06-26 DIAGNOSIS — N939 Abnormal uterine and vaginal bleeding, unspecified: Secondary | ICD-10-CM | POA: Diagnosis not present

## 2015-06-26 DIAGNOSIS — Z3040 Encounter for surveillance of contraceptives, unspecified: Secondary | ICD-10-CM | POA: Diagnosis not present

## 2015-06-26 DIAGNOSIS — N390 Urinary tract infection, site not specified: Secondary | ICD-10-CM | POA: Diagnosis not present

## 2015-07-17 DIAGNOSIS — R002 Palpitations: Secondary | ICD-10-CM | POA: Diagnosis not present

## 2015-07-17 DIAGNOSIS — E663 Overweight: Secondary | ICD-10-CM | POA: Diagnosis not present

## 2016-01-02 DIAGNOSIS — R87619 Unspecified abnormal cytological findings in specimens from cervix uteri: Secondary | ICD-10-CM | POA: Diagnosis not present

## 2016-01-02 DIAGNOSIS — Z01419 Encounter for gynecological examination (general) (routine) without abnormal findings: Secondary | ICD-10-CM | POA: Diagnosis not present

## 2016-01-02 DIAGNOSIS — Z1231 Encounter for screening mammogram for malignant neoplasm of breast: Secondary | ICD-10-CM | POA: Diagnosis not present

## 2016-01-02 DIAGNOSIS — R87610 Atypical squamous cells of undetermined significance on cytologic smear of cervix (ASC-US): Secondary | ICD-10-CM | POA: Diagnosis not present

## 2016-01-02 DIAGNOSIS — Z113 Encounter for screening for infections with a predominantly sexual mode of transmission: Secondary | ICD-10-CM | POA: Diagnosis not present

## 2016-08-07 ENCOUNTER — Other Ambulatory Visit: Payer: 59 | Admitting: *Deleted

## 2016-08-07 DIAGNOSIS — R072 Precordial pain: Secondary | ICD-10-CM | POA: Diagnosis not present

## 2016-08-07 DIAGNOSIS — R002 Palpitations: Secondary | ICD-10-CM | POA: Diagnosis not present

## 2016-08-07 DIAGNOSIS — E7800 Pure hypercholesterolemia, unspecified: Secondary | ICD-10-CM

## 2016-08-07 DIAGNOSIS — E559 Vitamin D deficiency, unspecified: Secondary | ICD-10-CM | POA: Diagnosis not present

## 2016-08-07 DIAGNOSIS — Z79899 Other long term (current) drug therapy: Secondary | ICD-10-CM

## 2016-08-07 DIAGNOSIS — E6609 Other obesity due to excess calories: Secondary | ICD-10-CM | POA: Diagnosis not present

## 2016-08-07 DIAGNOSIS — E876 Hypokalemia: Secondary | ICD-10-CM | POA: Diagnosis not present

## 2016-08-07 DIAGNOSIS — R42 Dizziness and giddiness: Secondary | ICD-10-CM | POA: Diagnosis not present

## 2016-08-07 DIAGNOSIS — D500 Iron deficiency anemia secondary to blood loss (chronic): Secondary | ICD-10-CM | POA: Diagnosis not present

## 2016-08-07 NOTE — Addendum Note (Signed)
Addended by: Eulis Foster on: 08/07/2016 01:38 PM   Modules accepted: Orders

## 2016-08-07 NOTE — Addendum Note (Signed)
Addended by: Eulis Foster on: 08/07/2016 01:37 PM   Modules accepted: Orders

## 2016-08-08 LAB — VITAMIN D 25 HYDROXY (VIT D DEFICIENCY, FRACTURES): Vit D, 25-Hydroxy: 20.3 ng/mL — ABNORMAL LOW (ref 30.0–100.0)

## 2016-08-12 LAB — CBC WITH DIFFERENTIAL/PLATELET
Basophils Absolute: 0 x10E3/uL (ref 0.0–0.2)
Basos: 0 %
EOS (ABSOLUTE): 0.1 x10E3/uL (ref 0.0–0.4)
Eos: 1 %
Hematocrit: 40.9 % (ref 34.0–46.6)
Hemoglobin: 13.3 g/dL (ref 11.1–15.9)
Immature Grans (Abs): 0 x10E3/uL (ref 0.0–0.1)
Immature Granulocytes: 0 %
Lymphocytes Absolute: 3.6 x10E3/uL — ABNORMAL HIGH (ref 0.7–3.1)
Lymphs: 36 %
MCH: 26.4 pg — ABNORMAL LOW (ref 26.6–33.0)
MCHC: 32.5 g/dL (ref 31.5–35.7)
MCV: 81 fL (ref 79–97)
Monocytes Absolute: 0.5 x10E3/uL (ref 0.1–0.9)
Monocytes: 5 %
Neutrophils Absolute: 5.8 x10E3/uL (ref 1.4–7.0)
Neutrophils: 58 %
Platelets: 313 x10E3/uL (ref 150–379)
RBC: 5.04 x10E6/uL (ref 3.77–5.28)
RDW: 15.2 % (ref 12.3–15.4)
WBC: 10 x10E3/uL (ref 3.4–10.8)

## 2016-08-12 LAB — HEPATIC FUNCTION PANEL
ALT: 16 IU/L (ref 0–32)
AST: 15 IU/L (ref 0–40)
Albumin: 4.7 g/dL (ref 3.5–5.5)
Alkaline Phosphatase: 88 IU/L (ref 39–117)
Bilirubin Total: 0.3 mg/dL (ref 0.0–1.2)
Bilirubin, Direct: 0.11 mg/dL (ref 0.00–0.40)
Total Protein: 7.6 g/dL (ref 6.0–8.5)

## 2016-08-12 LAB — BASIC METABOLIC PANEL
BUN/Creatinine Ratio: 12 (ref 9–23)
BUN: 13 mg/dL (ref 6–24)
CO2: 20 mmol/L (ref 18–29)
Calcium: 9.4 mg/dL (ref 8.7–10.2)
Chloride: 104 mmol/L (ref 96–106)
Creatinine, Ser: 1.07 mg/dL — ABNORMAL HIGH (ref 0.57–1.00)
GFR calc Af Amer: 74 mL/min/{1.73_m2} (ref >59)
GFR calc non Af Amer: 64 mL/min/{1.73_m2} (ref >59)
Glucose: 89 mg/dL (ref 65–99)
Potassium: 4.7 mmol/L (ref 3.5–5.2)
Sodium: 145 mmol/L — ABNORMAL HIGH (ref 134–144)

## 2016-08-12 LAB — LIPID PANEL
Chol/HDL Ratio: 3.7 ratio (ref 0.0–4.4)
Cholesterol, Total: 158 mg/dL (ref 100–199)
HDL: 43 mg/dL
LDL Calculated: 103 mg/dL — ABNORMAL HIGH (ref 0–99)
Triglycerides: 58 mg/dL (ref 0–149)
VLDL Cholesterol Cal: 12 mg/dL (ref 5–40)

## 2016-08-12 LAB — VITAMIN D 1,25 DIHYDROXY
Vitamin D 1, 25 (OH)2 Total: 65 pg/mL
Vitamin D2 1, 25 (OH)2: <10 pg/mL
Vitamin D3 1, 25 (OH)2: 65 pg/mL

## 2016-08-13 DIAGNOSIS — E6609 Other obesity due to excess calories: Secondary | ICD-10-CM | POA: Diagnosis not present

## 2016-08-13 DIAGNOSIS — R002 Palpitations: Secondary | ICD-10-CM | POA: Diagnosis not present

## 2016-08-13 DIAGNOSIS — R072 Precordial pain: Secondary | ICD-10-CM | POA: Diagnosis not present

## 2016-08-13 DIAGNOSIS — R42 Dizziness and giddiness: Secondary | ICD-10-CM | POA: Diagnosis not present

## 2016-09-17 DIAGNOSIS — R072 Precordial pain: Secondary | ICD-10-CM | POA: Diagnosis not present

## 2016-09-17 DIAGNOSIS — I361 Nonrheumatic tricuspid (valve) insufficiency: Secondary | ICD-10-CM | POA: Diagnosis not present

## 2016-10-07 ENCOUNTER — Encounter (HOSPITAL_COMMUNITY): Payer: Self-pay | Admitting: Emergency Medicine

## 2016-10-07 ENCOUNTER — Emergency Department (HOSPITAL_COMMUNITY): Payer: 59

## 2016-10-07 ENCOUNTER — Emergency Department (HOSPITAL_COMMUNITY)
Admission: EM | Admit: 2016-10-07 | Discharge: 2016-10-07 | Disposition: A | Discharge status: Home / Self Care | Payer: 59 | Attending: Emergency Medicine | Admitting: Emergency Medicine

## 2016-10-07 DIAGNOSIS — F17200 Nicotine dependence, unspecified, uncomplicated: Secondary | ICD-10-CM | POA: Insufficient documentation

## 2016-10-07 DIAGNOSIS — F320 Major depressive disorder, single episode, mild: Secondary | ICD-10-CM | POA: Diagnosis not present

## 2016-10-07 DIAGNOSIS — R0602 Shortness of breath: Secondary | ICD-10-CM | POA: Diagnosis not present

## 2016-10-07 DIAGNOSIS — R5383 Other fatigue: Secondary | ICD-10-CM | POA: Diagnosis not present

## 2016-10-07 DIAGNOSIS — F329 Major depressive disorder, single episode, unspecified: Secondary | ICD-10-CM | POA: Diagnosis not present

## 2016-10-07 DIAGNOSIS — F419 Anxiety disorder, unspecified: Secondary | ICD-10-CM | POA: Diagnosis not present

## 2016-10-07 DIAGNOSIS — R0789 Other chest pain: Secondary | ICD-10-CM | POA: Diagnosis not present

## 2016-10-07 LAB — CBC
HCT: 40.4 % (ref 36.0–46.0)
Hemoglobin: 12.8 g/dL (ref 12.0–15.0)
MCH: 26.3 pg (ref 26.0–34.0)
MCHC: 31.7 g/dL (ref 30.0–36.0)
MCV: 83.1 fL (ref 78.0–100.0)
Platelets: 302 10*3/uL (ref 150–400)
RBC: 4.86 MIL/uL (ref 3.87–5.11)
RDW: 14.7 % (ref 11.5–15.5)
WBC: 15.6 10*3/uL — ABNORMAL HIGH (ref 4.0–10.5)

## 2016-10-07 LAB — BASIC METABOLIC PANEL
Anion gap: 6 (ref 5–15)
BUN: 9 mg/dL (ref 6–20)
CO2: 23 mmol/L (ref 22–32)
Calcium: 8.8 mg/dL — ABNORMAL LOW (ref 8.9–10.3)
Chloride: 109 mmol/L (ref 101–111)
Creatinine, Ser: 1.23 mg/dL — ABNORMAL HIGH (ref 0.44–1.00)
GFR calc Af Amer: >60 mL/min (ref >60)
GFR calc non Af Amer: 53 mL/min — ABNORMAL LOW (ref >60)
Glucose, Bld: 127 mg/dL — ABNORMAL HIGH (ref 65–99)
Potassium: 3.7 mmol/L (ref 3.5–5.1)
Sodium: 138 mmol/L (ref 135–145)

## 2016-10-07 LAB — I-STAT TROPONIN, ED: Troponin i, poc: 0 ng/mL (ref 0.00–0.08)

## 2016-10-07 NOTE — ED Notes (Signed)
Pt ambulated with pulse ox, appears anxious but independent without assistance. Oxygen saturation 99% without walk on room air.

## 2016-10-07 NOTE — ED Notes (Signed)
Pt reports that she awoke at 0130 this morning with the sensation of someone choking her, "someone had their hands around my throat." States when she went to bed she was fine. Reports no pain, discomfort, SHOB at this time, states she feels better.

## 2016-10-07 NOTE — ED Provider Notes (Signed)
[facility] DEPT Provider Note   CSN: [id number] Arrival date & time: 10/07/16  0206     History   Chief Complaint Chief Complaint  Patient presents with  . Shortness of Breath  . Anxiety  . Chest Pain    HPI Zoe Morris is a 41 y.o. female.  The history is provided by the patient.  Shortness of Breath  This is a new problem. Duration: just prior to arrival. The problem occurs frequently.The problem has been gradually improving. Associated symptoms include chest pain. Pertinent negatives include no fever, no hemoptysis, no syncope, no vomiting, no abdominal pain, no leg pain and no leg swelling. Associated symptoms comments: Chest tightness . Associated medical issues do not include PE or CAD.  Anxiety  Associated symptoms include chest pain and shortness of breath. Pertinent negatives include no abdominal pain.  Chest Pain   Associated symptoms include shortness of breath. Pertinent negatives include no abdominal pain, no fever, no hemoptysis, no leg pain, no syncope and no vomiting.  Pertinent negatives for past medical history include no CAD and no PE.  Patient presents with shortness of breath and chest tightness She reports she went to bed feeling well She woke up short of breath "felt like being choked" She also reports chest tightness No fever/vomiting No hemoptysis She has never had this before She reports anxiety and reports recent stress at work She reports decreased sleep as well She is now improved She has seen cardiology before for palpitations No syncope reported No h/o CAD/PE   PMH - palpitations Soc hx - no drug use, occasional cigarette use OB History    No data available       Home Medications    Prior to Admission medications   Medication Sig Start Date End Date Taking? Authorizing Provider  doxycycline (VIBRA-TABS) 100 MG tablet Take 100 mg by mouth 2 (two) times daily. Completed course 2 weeks ago    [provider]    guaiFENesin-codeine (ROBITUSSIN AC) 100-10 MG/5ML syrup Take 5 mLs by mouth 3 (three) times daily as needed. Completed course 2 weeks ago    [provider]    Family History History reviewed. No pertinent family history.  Social History Social History  Substance Use Topics  . Smoking status: Current Every Day Smoker  . Smokeless tobacco: Never Used  . Alcohol use No     Allergies   Patient has no known allergies.   Review of Systems Review of Systems  Constitutional: Negative for fever.  Respiratory: Positive for choking and shortness of breath. Negative for hemoptysis.   Cardiovascular: Positive for chest pain. Negative for leg swelling and syncope.  Gastrointestinal: Negative for abdominal pain and vomiting.  Neurological: Negative for syncope.  Psychiatric/Behavioral: The patient is nervous/anxious.   All other systems reviewed and are negative.    Physical Exam Updated Vital Signs BP (!) 130/91   Pulse 94   Temp 99 F (37.2 C) (Oral)   Resp (!) 22   SpO2 100%   Physical Exam CONSTITUTIONAL: Well developed/well nourished, anxious HEAD: Normocephalic/atraumatic EYES: EOMI/PERRL ENMT: Mucous membranes moist, no stridor NECK: supple no meningeal signs, no anterior neck edema noted SPINE/BACK:entire spine nontender CV: S1/S2 noted, no murmurs/rubs/gallops noted LUNGS: Lungs are clear to auscultation bilaterally, no apparent distress ABDOMEN: soft, nontender, no rebound or guarding, bowel sounds noted throughout abdomen GU:no cva tenderness NEURO: Pt is awake/alert/appropriate, moves all extremitiesx4.  No facial droop.   EXTREMITIES: pulses normal/equal, full ROM, no LE edema  or tenderness SKIN: warm, color normal PSYCH: anxious  ED Treatments / Results  Labs (all labs ordered are listed, but only abnormal results are displayed) Labs Reviewed  BASIC METABOLIC PANEL - Abnormal; Notable for the following:       Result Value   Glucose, Bld 127 (*)     Creatinine, Ser 1.23 (*)    Calcium 8.8 (*)    GFR calc non Af Amer 53 (*)    All other components within normal limits  CBC - Abnormal; Notable for the following:    WBC 15.6 (*)    All other components within normal limits  I-STAT TROPOININ, ED    EKG  EKG Interpretation  Date/Time:  Tuesday Oct 07 2016 02:12:47 EDT Ventricular Rate:  106 PR Interval:  120 QRS Duration: 80 QT Interval:  320 QTC Calculation: 425 R Axis:   65 Text Interpretation:  Sinus tachycardia Cannot rule out Anterior infarct , age undetermined Abnormal ECG No previous ECGs available Confirmed by Ripley Fraise [phone]) on 10/07/2016 5:00:15 AM       Radiology Dg Chest 2 View  Result Date: 10/07/2016 CLINICAL DATA:  Awoke with tightness and squeezing in chest and throat. EXAM: CHEST  2 VIEW COMPARISON:  Radiographs 09/23/2011 FINDINGS: The cardiomediastinal contours are normal. The lungs are clear. Trachea is midline. Pulmonary vasculature is normal. No consolidation, pleural effusion, or pneumothorax. No acute osseous abnormalities are seen. IMPRESSION: No acute abnormality. Electronically Signed   By: Jeb Levering M.D.   On: 10/07/2016 02:46    Procedures Procedures    Medications Ordered in ED Medications - No data to display   Initial Impression / Assessment and Plan / ED Course  I have reviewed the triage vital signs and the nursing notes.  Pertinent labs & imaging results that were available during my care of the patient were reviewed by me and considered in my medical decision making (see chart for details).     Pt reports waking up feeling short of breath and felt like she was being choked She is now improved EKG unremarkable CXR negative Ambulating, she had no hypoxia and no significant tachycardia She does have implanon for birth control, but otherwise low risk for PE I doubt ACS at this time I doubt PE given history/exam Strong suspicion for anxiety component Will d/c  home Pt agreeable   Final Clinical Impressions(s) / ED Diagnoses   Final diagnoses:  Shortness of breath    New Prescriptions New Prescriptions   No medications on file     Ripley Fraise, MD 10/07/16 [phone]

## 2016-10-07 NOTE — ED Notes (Signed)
ED Provider at bedside. 

## 2016-10-07 NOTE — ED Notes (Signed)
Patient left at this time with all belongings. 

## 2016-10-24 DIAGNOSIS — Z760 Encounter for issue of repeat prescription: Secondary | ICD-10-CM | POA: Diagnosis not present

## 2016-10-24 DIAGNOSIS — R5383 Other fatigue: Secondary | ICD-10-CM | POA: Diagnosis not present

## 2016-10-24 DIAGNOSIS — G4700 Insomnia, unspecified: Secondary | ICD-10-CM | POA: Diagnosis not present

## 2016-10-24 DIAGNOSIS — Z79899 Other long term (current) drug therapy: Secondary | ICD-10-CM | POA: Diagnosis not present

## 2016-10-30 DIAGNOSIS — Z3049 Encounter for surveillance of other contraceptives: Secondary | ICD-10-CM | POA: Diagnosis not present

## 2016-10-30 DIAGNOSIS — N926 Irregular menstruation, unspecified: Secondary | ICD-10-CM | POA: Diagnosis not present

## 2016-10-30 DIAGNOSIS — F418 Other specified anxiety disorders: Secondary | ICD-10-CM | POA: Diagnosis not present

## 2016-10-30 DIAGNOSIS — F17210 Nicotine dependence, cigarettes, uncomplicated: Secondary | ICD-10-CM | POA: Diagnosis not present

## 2016-10-30 DIAGNOSIS — Z309 Encounter for contraceptive management, unspecified: Secondary | ICD-10-CM | POA: Diagnosis not present

## 2016-10-30 DIAGNOSIS — Z30430 Encounter for insertion of intrauterine contraceptive device: Secondary | ICD-10-CM | POA: Diagnosis not present

## 2016-10-30 DIAGNOSIS — R55 Syncope and collapse: Secondary | ICD-10-CM | POA: Diagnosis not present

## 2016-11-17 MED FILL — ESCITALOPRAM 10 MG TABLET: 10 | 30 days supply | Qty: 30 | Fill #0

## 2016-12-02 DIAGNOSIS — R300 Dysuria: Secondary | ICD-10-CM | POA: Diagnosis not present

## 2016-12-02 MED FILL — SULFAMETHOXAZOLE/TMP DS TAB: 800-160 | 3 days supply | Qty: 6 | Fill #0

## 2016-12-11 DIAGNOSIS — N390 Urinary tract infection, site not specified: Secondary | ICD-10-CM | POA: Diagnosis not present

## 2016-12-11 DIAGNOSIS — D259 Leiomyoma of uterus, unspecified: Secondary | ICD-10-CM | POA: Diagnosis not present

## 2016-12-11 DIAGNOSIS — Z30431 Encounter for routine checking of intrauterine contraceptive device: Secondary | ICD-10-CM | POA: Diagnosis not present

## 2016-12-18 MED FILL — ESCITALOPRAM 10 MG TABLET: 10 | 30 days supply | Qty: 30 | Fill #1

## 2017-01-05 DIAGNOSIS — Q512 Other doubling of uterus: Secondary | ICD-10-CM | POA: Diagnosis not present

## 2017-01-05 DIAGNOSIS — Z01411 Encounter for gynecological examination (general) (routine) with abnormal findings: Secondary | ICD-10-CM | POA: Diagnosis not present

## 2017-01-05 DIAGNOSIS — Z113 Encounter for screening for infections with a predominantly sexual mode of transmission: Secondary | ICD-10-CM | POA: Diagnosis not present

## 2017-01-05 DIAGNOSIS — Z6832 Body mass index (BMI) 32.0-32.9, adult: Secondary | ICD-10-CM | POA: Diagnosis not present

## 2017-01-05 DIAGNOSIS — D259 Leiomyoma of uterus, unspecified: Secondary | ICD-10-CM | POA: Diagnosis not present

## 2017-01-05 DIAGNOSIS — Z3040 Encounter for surveillance of contraceptives, unspecified: Secondary | ICD-10-CM | POA: Diagnosis not present

## 2017-01-21 DIAGNOSIS — Z3040 Encounter for surveillance of contraceptives, unspecified: Secondary | ICD-10-CM | POA: Diagnosis not present

## 2017-01-21 DIAGNOSIS — R635 Abnormal weight gain: Secondary | ICD-10-CM | POA: Diagnosis not present

## 2017-01-21 DIAGNOSIS — Q51810 Arcuate uterus: Secondary | ICD-10-CM | POA: Diagnosis not present

## 2017-01-21 DIAGNOSIS — Q512 Other doubling of uterus: Secondary | ICD-10-CM | POA: Diagnosis not present

## 2017-01-21 DIAGNOSIS — Z1231 Encounter for screening mammogram for malignant neoplasm of breast: Secondary | ICD-10-CM | POA: Diagnosis not present

## 2017-01-21 DIAGNOSIS — F411 Generalized anxiety disorder: Secondary | ICD-10-CM | POA: Diagnosis not present

## 2017-01-21 DIAGNOSIS — F320 Major depressive disorder, single episode, mild: Secondary | ICD-10-CM | POA: Diagnosis not present

## 2017-01-23 MED FILL — ESCITALOPRAM 10 MG TABLET: 10 | 30 days supply | Qty: 30 | Fill #2

## 2017-02-24 MED FILL — ESCITALOPRAM 10 MG TABLET: 10 | 30 days supply | Qty: 30 | Fill #3

## 2017-12-04 DIAGNOSIS — M25561 Pain in right knee: Secondary | ICD-10-CM | POA: Diagnosis not present

## 2017-12-08 DIAGNOSIS — S83241A Other tear of medial meniscus, current injury, right knee, initial encounter: Secondary | ICD-10-CM | POA: Diagnosis not present

## 2017-12-08 DIAGNOSIS — M25561 Pain in right knee: Secondary | ICD-10-CM | POA: Diagnosis not present

## 2017-12-15 ENCOUNTER — Other Ambulatory Visit: Payer: Self-pay

## 2017-12-15 ENCOUNTER — Encounter (HOSPITAL_BASED_OUTPATIENT_CLINIC_OR_DEPARTMENT_OTHER): Payer: Self-pay | Admitting: *Deleted

## 2017-12-18 ENCOUNTER — Encounter (HOSPITAL_BASED_OUTPATIENT_CLINIC_OR_DEPARTMENT_OTHER): Payer: Self-pay | Admitting: Physician Assistant

## 2017-12-18 DIAGNOSIS — J45909 Unspecified asthma, uncomplicated: Secondary | ICD-10-CM | POA: Diagnosis present

## 2017-12-18 DIAGNOSIS — S83249A Other tear of medial meniscus, current injury, unspecified knee, initial encounter: Secondary | ICD-10-CM | POA: Diagnosis present

## 2017-12-18 DIAGNOSIS — S83511A Sprain of anterior cruciate ligament of right knee, initial encounter: Secondary | ICD-10-CM | POA: Diagnosis present

## 2017-12-18 DIAGNOSIS — R002 Palpitations: Secondary | ICD-10-CM | POA: Diagnosis present

## 2017-12-18 NOTE — H&P (Signed)
Zoe Morris is an 42 y.o. female.   Chief Complaint: right knee ACL and medial meniscus tear  HPI: Alert and oriented.  Examination of her right knee reveals pain on the medial joint line.  Positive medial McMurray.  Range of motion -20 to 90 degrees.  Knee is otherwise stable.  Examination of the left knee reveals pain in the patellofemoral joint.  Pain with twisting and turning activities noted with patellar apprehension.   Past Medical History:  Diagnosis Date  . Asthma   . Palpitations     History reviewed. No pertinent surgical history.  History reviewed. No pertinent family history. Social History:  reports that she has been smoking.  She has never used smokeless tobacco. She reports that she does not drink alcohol or use drugs.  Allergies: No Known Allergies  No medications prior to admission.    No results found for this or any previous visit (from the past 48 hour(s)). No results found.  Review of Systems  Constitutional: Negative.   HENT: Negative.   Eyes: Negative.   Respiratory: Negative.   Cardiovascular: Negative.   Gastrointestinal: Negative.   Genitourinary: Negative.   Musculoskeletal: Positive for joint pain.  Skin: Negative.   Neurological: Negative.   Endo/Heme/Allergies: Negative.   Psychiatric/Behavioral: Negative.     Height 5\' 2"  (1.575 m), weight 81.6 kg (180 lb). Physical Exam  Constitutional: She is oriented to person, place, and time. She appears well-developed and well-nourished.  HENT:  Head: Normocephalic and atraumatic.  Mouth/Throat: Oropharynx is clear and moist.  Eyes: Pupils are equal, round, and reactive to light. Conjunctivae are normal.  Neck: Neck supple.  Cardiovascular: Normal rate.  Respiratory: Effort normal.  GI: Soft.  Genitourinary:  Genitourinary Comments: Not pertinent to current symptomatology therefore not examined.  Musculoskeletal:   Examination of her right knee reveals pain on the medial joint line.   Positive medial McMurray.  Range of motion -20 to 90 degrees.  Knee is otherwise stable.  Examination of the left knee reveals pain in the patellofemoral joint.  Pain with twisting and turning activities noted with patellar apprehension.   Neurological: She is alert and oriented to person, place, and time.  Skin: Skin is warm and dry.  Psychiatric: She has a normal mood and affect. Her behavior is normal.     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  I have told her with these findings and her significant pain and instability and the fact that she has had significant instability in the knee on and off, other than just this more recent injury, I would recommend that we proceed with right knee hamstring allograft ACL reconstruction with medial meniscectomy.  Risks, complications and benefits of the surgery have been described to her in detail and she understands this completely.  We are going to put her out of work now, as she cannot ambulate without significant pain and she will be out of work from now until approximately six weeks postoperatively as well, so this may be a total of 8-10 weeks of out of work time.    Kirstin J Shepperson, PA-C 12/18/2017, 8:23 PM

## 2017-12-23 ENCOUNTER — Other Ambulatory Visit: Payer: Self-pay

## 2017-12-23 ENCOUNTER — Ambulatory Visit (HOSPITAL_BASED_OUTPATIENT_CLINIC_OR_DEPARTMENT_OTHER)
Admission: RE | Admit: 2017-12-23 | Discharge: 2017-12-23 | Disposition: A | Discharge status: Home / Self Care | Payer: 59 | Source: Ambulatory Visit | Attending: Orthopedic Surgery | Admitting: Orthopedic Surgery

## 2017-12-23 ENCOUNTER — Ambulatory Visit (HOSPITAL_BASED_OUTPATIENT_CLINIC_OR_DEPARTMENT_OTHER): Payer: 59 | Admitting: Certified Registered"

## 2017-12-23 ENCOUNTER — Encounter (HOSPITAL_BASED_OUTPATIENT_CLINIC_OR_DEPARTMENT_OTHER): Payer: Self-pay | Admitting: *Deleted

## 2017-12-23 ENCOUNTER — Encounter (HOSPITAL_BASED_OUTPATIENT_CLINIC_OR_DEPARTMENT_OTHER)
Admission: RE | Disposition: A | Discharge status: Home / Self Care | Payer: Self-pay | Source: Ambulatory Visit | Attending: Orthopedic Surgery

## 2017-12-23 DIAGNOSIS — X58XXXA Exposure to other specified factors, initial encounter: Secondary | ICD-10-CM | POA: Diagnosis not present

## 2017-12-23 DIAGNOSIS — S83211A Bucket-handle tear of medial meniscus, current injury, right knee, initial encounter: Secondary | ICD-10-CM | POA: Insufficient documentation

## 2017-12-23 DIAGNOSIS — S83251A Bucket-handle tear of lateral meniscus, current injury, right knee, initial encounter: Secondary | ICD-10-CM | POA: Insufficient documentation

## 2017-12-23 DIAGNOSIS — G8918 Other acute postprocedural pain: Secondary | ICD-10-CM | POA: Diagnosis not present

## 2017-12-23 DIAGNOSIS — S83511A Sprain of anterior cruciate ligament of right knee, initial encounter: Secondary | ICD-10-CM | POA: Diagnosis not present

## 2017-12-23 DIAGNOSIS — X501XXA Overexertion from prolonged static or awkward postures, initial encounter: Secondary | ICD-10-CM | POA: Insufficient documentation

## 2017-12-23 DIAGNOSIS — S83241A Other tear of medial meniscus, current injury, right knee, initial encounter: Secondary | ICD-10-CM | POA: Diagnosis not present

## 2017-12-23 DIAGNOSIS — S83281A Other tear of lateral meniscus, current injury, right knee, initial encounter: Secondary | ICD-10-CM | POA: Diagnosis not present

## 2017-12-23 DIAGNOSIS — M94261 Chondromalacia, right knee: Secondary | ICD-10-CM | POA: Insufficient documentation

## 2017-12-23 DIAGNOSIS — F17200 Nicotine dependence, unspecified, uncomplicated: Secondary | ICD-10-CM | POA: Diagnosis not present

## 2017-12-23 DIAGNOSIS — J45909 Unspecified asthma, uncomplicated: Secondary | ICD-10-CM | POA: Diagnosis not present

## 2017-12-23 DIAGNOSIS — R002 Palpitations: Secondary | ICD-10-CM | POA: Diagnosis present

## 2017-12-23 DIAGNOSIS — S83249A Other tear of medial meniscus, current injury, unspecified knee, initial encounter: Secondary | ICD-10-CM | POA: Diagnosis present

## 2017-12-23 HISTORY — PX: KNEE ARTHROSCOPY WITH MEDIAL MENISECTOMY: SHX5651

## 2017-12-23 HISTORY — DX: Unspecified asthma, uncomplicated: J45.909

## 2017-12-23 HISTORY — DX: Palpitations: R00.2

## 2017-12-23 HISTORY — DX: Sprain of anterior cruciate ligament of right knee, initial encounter: S83.511A

## 2017-12-23 HISTORY — DX: Other tear of medial meniscus, current injury, unspecified knee, initial encounter: S83.249A

## 2017-12-23 HISTORY — PX: KNEE ARTHROSCOPY WITH ANTERIOR CRUCIATE LIGAMENT (ACL) REPAIR WITH HAMSTRING GRAFT: SHX5645

## 2017-12-23 SURGERY — KNEE ARTHROSCOPY WITH ANTERIOR CRUCIATE LIGAMENT (ACL) REPAIR WITH HAMSTRING GRAFT
Anesthesia: General | Site: Knee | Laterality: Right

## 2017-12-23 MED ORDER — PROPOFOL 10 MG/ML IV BOLUS
INTRAVENOUS | Status: AC
  Filled 2017-12-23: qty 20

## 2017-12-23 MED ORDER — MIDAZOLAM HCL 5 MG/5ML IJ SOLN
INTRAMUSCULAR | Status: DC | PRN
  Administered 2017-12-23: 2 mg via INTRAVENOUS

## 2017-12-23 MED ORDER — ACETAMINOPHEN 500 MG PO TABS
1000.0000 mg | ORAL_TABLET | Freq: Once | ORAL | Status: AC
  Administered 2017-12-23: 1000 mg via ORAL

## 2017-12-23 MED ORDER — LACTATED RINGERS IV SOLN
INTRAVENOUS | Status: DC
  Administered 2017-12-23: 07:00:00 via INTRAVENOUS

## 2017-12-23 MED ORDER — PROPOFOL 10 MG/ML IV BOLUS
INTRAVENOUS | Status: DC | PRN
  Administered 2017-12-23: 150 mg via INTRAVENOUS

## 2017-12-23 MED ORDER — POVIDONE-IODINE 7.5 % EX SOLN: Freq: Once | CUTANEOUS | Status: DC

## 2017-12-23 MED ORDER — CHLORHEXIDINE GLUCONATE 4 % EX LIQD: 60.0000 mL | Freq: Once | CUTANEOUS | Status: DC

## 2017-12-23 MED ORDER — FENTANYL CITRATE (PF) 100 MCG/2ML IJ SOLN: 25.0000 ug | INTRAMUSCULAR | Status: DC | PRN

## 2017-12-23 MED ORDER — CEFAZOLIN SODIUM-DEXTROSE 2-4 GM/100ML-% IV SOLN
2.0000 g | INTRAVENOUS | Status: AC
  Administered 2017-12-23: 2 g via INTRAVENOUS

## 2017-12-23 MED ORDER — LIDOCAINE HCL (CARDIAC) PF 100 MG/5ML IV SOSY
PREFILLED_SYRINGE | INTRAVENOUS | Status: AC
  Filled 2017-12-23: qty 5

## 2017-12-23 MED ORDER — LACTATED RINGERS IV SOLN
INTRAVENOUS | Status: DC
  Administered 2017-12-23 (×2): via INTRAVENOUS

## 2017-12-23 MED ORDER — DEXAMETHASONE SODIUM PHOSPHATE 4 MG/ML IJ SOLN
INTRAMUSCULAR | Status: DC | PRN
  Administered 2017-12-23: 10 mg via INTRAVENOUS

## 2017-12-23 MED ORDER — CYCLOBENZAPRINE HCL 5 MG PO TABS: 5.0000 mg | ORAL_TABLET | Freq: Three times a day (TID) | ORAL | 0 refills | Status: DC | PRN

## 2017-12-23 MED ORDER — CELECOXIB 200 MG PO CAPS
ORAL_CAPSULE | ORAL | Status: AC
  Filled 2017-12-23: qty 1

## 2017-12-23 MED ORDER — SCOPOLAMINE 1 MG/3DAYS TD PT72: 1.0000 | MEDICATED_PATCH | Freq: Once | TRANSDERMAL | Status: DC | PRN

## 2017-12-23 MED ORDER — ROPIVACAINE HCL 7.5 MG/ML IJ SOLN
INTRAMUSCULAR | Status: DC | PRN
  Administered 2017-12-23: 20 mL via PERINEURAL

## 2017-12-23 MED ORDER — MIDAZOLAM HCL 2 MG/2ML IJ SOLN
1.0000 mg | INTRAMUSCULAR | Status: DC | PRN
  Administered 2017-12-23: 2 mg via INTRAVENOUS

## 2017-12-23 MED ORDER — FENTANYL CITRATE (PF) 100 MCG/2ML IJ SOLN
50.0000 ug | INTRAMUSCULAR | Status: DC | PRN
  Administered 2017-12-23: 50 ug via INTRAVENOUS

## 2017-12-23 MED ORDER — FENTANYL CITRATE (PF) 100 MCG/2ML IJ SOLN
INTRAMUSCULAR | Status: DC | PRN
  Administered 2017-12-23: 100 ug via INTRAVENOUS

## 2017-12-23 MED ORDER — EPINEPHRINE 30 MG/30ML IJ SOLN
INTRAMUSCULAR | Status: AC
  Filled 2017-12-23: qty 1

## 2017-12-23 MED ORDER — FENTANYL CITRATE (PF) 100 MCG/2ML IJ SOLN
INTRAMUSCULAR | Status: AC
  Filled 2017-12-23: qty 2

## 2017-12-23 MED ORDER — BUPIVACAINE-EPINEPHRINE (PF) 0.25% -1:200000 IJ SOLN
INTRAMUSCULAR | Status: AC
  Filled 2017-12-23: qty 30

## 2017-12-23 MED ORDER — GABAPENTIN 300 MG PO CAPS
300.0000 mg | ORAL_CAPSULE | Freq: Once | ORAL | Status: AC
  Administered 2017-12-23: 300 mg via ORAL

## 2017-12-23 MED ORDER — PROMETHAZINE HCL 25 MG/ML IJ SOLN: 6.2500 mg | INTRAMUSCULAR | Status: DC | PRN

## 2017-12-23 MED ORDER — PHENYLEPHRINE HCL 10 MG/ML IJ SOLN
INTRAMUSCULAR | Status: DC | PRN
  Administered 2017-12-23 (×2): 80 ug via INTRAVENOUS

## 2017-12-23 MED ORDER — CEFAZOLIN SODIUM-DEXTROSE 2-4 GM/100ML-% IV SOLN
INTRAVENOUS | Status: AC
  Filled 2017-12-23: qty 100

## 2017-12-23 MED ORDER — DEXAMETHASONE SODIUM PHOSPHATE 10 MG/ML IJ SOLN
INTRAMUSCULAR | Status: AC
Start: 2017-12-23 — End: ?
  Filled 2017-12-23: qty 1

## 2017-12-23 MED ORDER — EPHEDRINE SULFATE 50 MG/ML IJ SOLN
INTRAMUSCULAR | Status: DC | PRN
  Administered 2017-12-23: 25 mg via INTRAVENOUS
  Administered 2017-12-23 (×2): 10 mg via INTRAVENOUS

## 2017-12-23 MED ORDER — ACETAMINOPHEN 500 MG PO TABS
ORAL_TABLET | ORAL | Status: AC
  Filled 2017-12-23: qty 2

## 2017-12-23 MED ORDER — MIDAZOLAM HCL 2 MG/2ML IJ SOLN
INTRAMUSCULAR | Status: AC
  Filled 2017-12-23: qty 2

## 2017-12-23 MED ORDER — GLYCOPYRROLATE 0.2 MG/ML IJ SOLN
INTRAMUSCULAR | Status: DC | PRN
  Administered 2017-12-23: 0.1 mg via INTRAVENOUS

## 2017-12-23 MED ORDER — OXYCODONE HCL 5 MG PO TABS: 5.0000 mg | ORAL_TABLET | ORAL | 0 refills | Status: DC | PRN

## 2017-12-23 MED ORDER — EPINEPHRINE PF 1 MG/ML IJ SOLN
INTRAMUSCULAR | Status: DC | PRN
  Administered 2017-12-23: 16000 mL

## 2017-12-23 MED ORDER — BUPIVACAINE-EPINEPHRINE 0.25% -1:200000 IJ SOLN
INTRAMUSCULAR | Status: DC | PRN
  Administered 2017-12-23: 20 mL

## 2017-12-23 MED ORDER — GLYCOPYRROLATE PF 0.2 MG/ML IJ SOSY
PREFILLED_SYRINGE | INTRAMUSCULAR | Status: AC
  Filled 2017-12-23: qty 1

## 2017-12-23 MED ORDER — GABAPENTIN 300 MG PO CAPS
ORAL_CAPSULE | ORAL | Status: AC
  Filled 2017-12-23: qty 1

## 2017-12-23 MED ORDER — CELECOXIB 200 MG PO CAPS
200.0000 mg | ORAL_CAPSULE | Freq: Once | ORAL | Status: AC
  Administered 2017-12-23: 200 mg via ORAL

## 2017-12-23 MED ORDER — LIDOCAINE HCL (CARDIAC) PF 100 MG/5ML IV SOSY
PREFILLED_SYRINGE | INTRAVENOUS | Status: DC | PRN
  Administered 2017-12-23: 80 mg via INTRAVENOUS

## 2017-12-23 MED ORDER — ONDANSETRON HCL 4 MG/2ML IJ SOLN
INTRAMUSCULAR | Status: AC
  Filled 2017-12-23: qty 2

## 2017-12-23 MED FILL — oxyCODONE HCL 5 MG TABS: 5 | 4 days supply | Qty: 25 | Fill #0

## 2017-12-23 MED FILL — CYCLOBENZAPRINE 5 MG TABLET: 5 | 7 days supply | Qty: 20 | Fill #0

## 2017-12-23 SURGICAL SUPPLY — 120 items
ALLOGRAFT GRFTLNK IMPLANT SYST (Anchor) IMPLANT
ANCHOR BUTTON TIGHTROPE RN 14 (Anchor) IMPLANT
APL SKNCLS STERI-STRIP NONHPOA (GAUZE/BANDAGES/DRESSINGS) ×1
BANDAGE ACE 4X5 VEL STRL LF (GAUZE/BANDAGES/DRESSINGS) ×3 IMPLANT
BANDAGE ACE 6X5 VEL STRL LF (GAUZE/BANDAGES/DRESSINGS) ×5 IMPLANT
BANDAGE ESMARK 6X9 LF (GAUZE/BANDAGES/DRESSINGS) IMPLANT
BENZOIN TINCTURE PRP APPL 2/3 (GAUZE/BANDAGES/DRESSINGS) ×3 IMPLANT
BLADE CUDA GRT WHITE 3.5 (BLADE) ×1 IMPLANT
BLADE CUTTER GATOR 3.5 (BLADE) ×1 IMPLANT
BLADE HEX COATED 2.75 (ELECTRODE) IMPLANT
BLADE SHAVER BONE 5.0MM X 13CM (MISCELLANEOUS) ×1
BLADE SHAVER BONE 5.0X13 (MISCELLANEOUS) ×1 IMPLANT
BLADE SURG 15 STRL LF DISP TIS (BLADE) ×1 IMPLANT
BLADE SURG 15 STRL SS (BLADE) ×3
BNDG CMPR 9X6 STRL LF SNTH (GAUZE/BANDAGES/DRESSINGS)
BNDG COHESIVE 4X5 TAN STRL (GAUZE/BANDAGES/DRESSINGS) IMPLANT
BNDG ESMARK 6X9 LF (GAUZE/BANDAGES/DRESSINGS)
BUR OVAL 6.0 (BURR) ×1 IMPLANT
CLOSURE WOUND 1/2 X4 (GAUZE/BANDAGES/DRESSINGS) ×1
COVER BACK TABLE 60X90IN (DRAPES) ×3 IMPLANT
CUTTER FLIP II 9.5MM (INSTRUMENTS) ×2 IMPLANT
DECANTER SPIKE VIAL GLASS SM (MISCELLANEOUS) IMPLANT
DISSECTOR  3.8MM X 13CM (MISCELLANEOUS) ×2
DISSECTOR 3.8MM X 13CM (MISCELLANEOUS) IMPLANT
DISSECTOR 4.0MMX13CM CVD (MISCELLANEOUS) ×2 IMPLANT
DRAPE ARTHROSCOPY W/POUCH 90 (DRAPES) ×3 IMPLANT
DRAPE IMP U-DRAPE 54X76 (DRAPES) ×3 IMPLANT
DRAPE OEC MINIVIEW 54X84 (DRAPES) ×3 IMPLANT
DRAPE U-SHAPE 47X51 STRL (DRAPES) ×3 IMPLANT
DRAPE U-SHAPE 76X120 STRL (DRAPES) ×3 IMPLANT
DRILL FLIPCUTTER II 10.5MM (CUTTER) IMPLANT
DRILL FLIPCUTTER II 10MM (CUTTER) IMPLANT
DRILL FLIPCUTTER II 7.0MM (INSTRUMENTS) IMPLANT
DRILL FLIPCUTTER II 7.5MM (MISCELLANEOUS) IMPLANT
DRILL FLIPCUTTER II 8.0MM (INSTRUMENTS) IMPLANT
DRILL FLIPCUTTER II 8.5MM (INSTRUMENTS) IMPLANT
DRILL FLIPCUTTER II 9.0MM (INSTRUMENTS) IMPLANT
DRSG PAD ABDOMINAL 8X10 ST (GAUZE/BANDAGES/DRESSINGS) IMPLANT
DURAPREP 26ML APPLICATOR (WOUND CARE) ×3 IMPLANT
ELECT REM PT RETURN 9FT ADLT (ELECTROSURGICAL) ×3
ELECTRODE REM PT RTRN 9FT ADLT (ELECTROSURGICAL) ×1 IMPLANT
FLIP CUTTER II 7.0MM (INSTRUMENTS)
FLIPCUTTER II 10.5MM (CUTTER)
FLIPCUTTER II 10MM (CUTTER)
FLIPCUTTER II 7.5MM (MISCELLANEOUS)
FLIPCUTTER II 8.0MM (INSTRUMENTS)
FLIPCUTTER II 8.5MM (INSTRUMENTS)
FLIPCUTTER II 9.0MM (INSTRUMENTS)
GAUZE SPONGE 4X4 12PLY STRL (GAUZE/BANDAGES/DRESSINGS) ×3 IMPLANT
GAUZE XEROFORM 1X8 LF (GAUZE/BANDAGES/DRESSINGS) ×3 IMPLANT
GLOVE BIO SURGEON STRL SZ7 (GLOVE) ×4 IMPLANT
GLOVE BIOGEL PI IND STRL 7.0 (GLOVE) ×2 IMPLANT
GLOVE BIOGEL PI IND STRL 7.5 (GLOVE) ×1 IMPLANT
GLOVE BIOGEL PI INDICATOR 7.0 (GLOVE) ×6
GLOVE BIOGEL PI INDICATOR 7.5 (GLOVE) ×2
GLOVE ECLIPSE 6.5 STRL STRAW (GLOVE) ×2 IMPLANT
GLOVE SS BIOGEL STRL SZ 7.5 (GLOVE) ×1 IMPLANT
GLOVE SUPERSENSE BIOGEL SZ 7.5 (GLOVE) ×2
GOWN STRL REUS W/ TWL LRG LVL3 (GOWN DISPOSABLE) ×2 IMPLANT
GOWN STRL REUS W/ TWL XL LVL3 (GOWN DISPOSABLE) ×2 IMPLANT
GOWN STRL REUS W/TWL LRG LVL3 (GOWN DISPOSABLE) ×6
GOWN STRL REUS W/TWL XL LVL3 (GOWN DISPOSABLE) ×3
GRAFT TISS 60-80 FRZN TENDON (Tissue) IMPLANT
GUIDEPIN REAMER CUTTER 11MM (INSTRUMENTS) IMPLANT
HOLDER KNEE FOAM BLUE (MISCELLANEOUS) ×3 IMPLANT
IMMOBILIZER KNEE 22 UNIV (SOFTGOODS) ×2 IMPLANT
IMMOBILIZER KNEE 24 THIGH 36 (MISCELLANEOUS) IMPLANT
IMMOBILIZER KNEE 24 UNIV (MISCELLANEOUS)
IV NS IRRIG 3000ML ARTHROMATIC (IV SOLUTION) ×12 IMPLANT
K-WIRE .062X4 (WIRE) IMPLANT
KNEE WRAP E Z 3 GEL PACK (MISCELLANEOUS) ×3 IMPLANT
MANIFOLD NEPTUNE II (INSTRUMENTS) ×3 IMPLANT
MARKER SKIN DUAL TIP RULER LAB (MISCELLANEOUS) ×3 IMPLANT
NDL SAFETY ECLIPSE 18X1.5 (NEEDLE) ×2 IMPLANT
NEEDLE HYPO 18GX1.5 SHARP (NEEDLE) ×6
NEEDLE HYPO 22GX1.5 SAFETY (NEEDLE) ×2 IMPLANT
NS IRRIG 1000ML POUR BTL (IV SOLUTION) ×2 IMPLANT
PACK ARTHROSCOPY DSU (CUSTOM PROCEDURE TRAY) ×3 IMPLANT
PACK BASIN DAY SURGERY FS (CUSTOM PROCEDURE TRAY) ×3 IMPLANT
PAD ALCOHOL SWAB (MISCELLANEOUS) ×8 IMPLANT
PAD CAST 4YDX4 CTTN HI CHSV (CAST SUPPLIES) IMPLANT
PADDING CAST COTTON 4X4 STRL (CAST SUPPLIES)
PENCIL BUTTON HOLSTER BLD 10FT (ELECTRODE) IMPLANT
PIN DRILL ACL TIGHTROPE 4MM (PIN) ×2 IMPLANT
PK GRAFTLINK ALLO IMPLANT SYST (Anchor) ×3 IMPLANT
SLEEVE SCD COMPRESS KNEE MED (MISCELLANEOUS) ×1 IMPLANT
SPONGE LAP 4X18 RFD (DISPOSABLE) ×3 IMPLANT
STOCKING TED THIGH LEN LRG REG (STOCKING)
STOCKING TED THIGH LEN MED REG (STOCKING) ×2
STOCKING THIGH LG REG (STOCKING) IMPLANT
STOCKING THIGH MED REG (STOCKING) IMPLANT
STRIP CLOSURE SKIN 1/2X4 (GAUZE/BANDAGES/DRESSINGS) ×2 IMPLANT
SUCTION FRAZIER HANDLE 10FR (MISCELLANEOUS) ×2
SUCTION TUBE FRAZIER 10FR DISP (MISCELLANEOUS) ×1 IMPLANT
SUT 0 FIBERLOOP 38 BLUE TPR ND (SUTURE)
SUT ETHILON 4 0 PS 2 18 (SUTURE) IMPLANT
SUT FIBERWIRE #2 38 REV NDL BL (SUTURE) ×6
SUT FIBERWIRE #2 38 T-5 BLUE (SUTURE)
SUT PDS AB 0 CT 36 (SUTURE) IMPLANT
SUT PROLENE 3 0 PS 2 (SUTURE) ×3 IMPLANT
SUT VIC AB 0 CT1 18XCR BRD 8 (SUTURE) IMPLANT
SUT VIC AB 0 CT1 8-18 (SUTURE)
SUT VIC AB 2-0 CT1 27 (SUTURE)
SUT VIC AB 2-0 CT1 TAPERPNT 27 (SUTURE) IMPLANT
SUT VIC AB 3-0 PS1 18 (SUTURE)
SUT VIC AB 3-0 PS1 18XBRD (SUTURE) IMPLANT
SUT VIC AB 3-0 SH 27 (SUTURE) ×3
SUT VIC AB 3-0 SH 27X BRD (SUTURE) ×1 IMPLANT
SUTURE 0 FIBERLP 38 BLU TPR ND (SUTURE) ×2 IMPLANT
SUTURE FIBERWR #2 38 T-5 BLUE (SUTURE) IMPLANT
SUTURE FIBERWR#2 38 REV NDL BL (SUTURE) ×2 IMPLANT
SYR 20CC LL (SYRINGE) ×2 IMPLANT
SYR 5ML LL (SYRINGE) ×3 IMPLANT
SYSTEM IMPL ACL/PCL SWIVILLOCK (Anchor) ×2 IMPLANT
TISSUE GRAFTLINK FGL (Tissue) ×3 IMPLANT
TOWEL GREEN STERILE FF (TOWEL DISPOSABLE) ×6 IMPLANT
TOWEL OR NON WOVEN STRL DISP B (DISPOSABLE) ×3 IMPLANT
TUBING ARTHRO INFLOW-ONLY STRL (TUBING) ×1 IMPLANT
WAND STAR VAC 90 (SURGICAL WAND) IMPLANT
WATER STERILE IRR 1000ML POUR (IV SOLUTION) ×1 IMPLANT

## 2017-12-23 NOTE — Anesthesia Procedure Notes (Signed)
Anesthesia Regional Block: Adductor canal block   Pre-Anesthetic Checklist: ,, timeout performed, Correct Patient, Correct Site, Correct Laterality, Correct Procedure, Correct Position, site marked, Risks and benefits discussed,  Surgical consent,  Pre-op evaluation,  At surgeon's request and post-op pain management  Laterality: Right  Prep: chloraprep       Needles:  Injection technique: Single-shot  Needle Type: Echogenic Needle     Needle Length: 9cm  Needle Gauge: 21     Additional Needles:   Procedures:,,,, ultrasound used (permanent image in chart),,,,  Narrative:  Start time: 12/23/2017 6:59 AM End time: 12/23/2017 7:05 AM Injection made incrementally with aspirations every 5 mL.  Performed by: Personally  Anesthesiologist: Catalina Gravel, MD  Additional Notes: No pain on injection. No increased resistance to injection. Injection made in 5cc increments.  Good needle visualization.  Patient tolerated procedure well.

## 2017-12-23 NOTE — Anesthesia Postprocedure Evaluation (Signed)
Anesthesia Post Note  Patient: Zoe Morris  Procedure(s) Performed: RIGHT KNEE ARTHROSCOPY WITH ANTERIOR CRUCIATE LIGAMENT (ACL) REPAIR WITH ALLOGRAFT HAMSTRING GRAFT (Right Knee) RIGHT KNEE ARTHROSCOPY WITH MEDIAL MENISECTOMY (Right Knee)     Patient location during evaluation: PACU Anesthesia Type: General Level of consciousness: awake and alert Pain management: pain level controlled Vital Signs Assessment: post-procedure vital signs reviewed and stable Respiratory status: spontaneous breathing, nonlabored ventilation and respiratory function stable Cardiovascular status: blood pressure returned to baseline and stable Postop Assessment: no apparent nausea or vomiting Anesthetic complications: no    Last Vitals:  Vitals:   12/23/17 0945 12/23/17 1040  BP: 112/88 (!) 145/85  Pulse: 86 (!) 109  Resp: 15 20  Temp:  36.6 C  SpO2: 100% 100%    Last Pain:  Vitals:   12/23/17 1040  TempSrc:   PainSc: 0-No pain                 Catalina Gravel

## 2017-12-23 NOTE — Discharge Instructions (Signed)

## 2017-12-23 NOTE — Anesthesia Preprocedure Evaluation (Signed)
Anesthesia Evaluation  Patient identified by MRN, date of birth, ID band Patient awake    Reviewed: Allergy & Precautions, NPO status , Patient's Chart, lab work & pertinent test results  Airway Mallampati: II  TM Distance: >3 FB Neck ROM: Full    Dental  (+) Teeth Intact, Dental Advisory Given   Pulmonary asthma , Current Smoker,    Pulmonary exam normal breath sounds clear to auscultation       Cardiovascular negative cardio ROS Normal cardiovascular exam Rhythm:Regular Rate:Normal     Neuro/Psych negative neurological ROS     GI/Hepatic negative GI ROS, Neg liver ROS,   Endo/Other  negative endocrine ROSObesity   Renal/GU negative Renal ROS     Musculoskeletal RIGHT KNEE ANTERIOR CRUCIATE LIGAMENT SPRAIN, RIGHT MEDIAL MENISCAL TEAR   Abdominal   Peds  Hematology negative hematology ROS (+)   Anesthesia Other Findings Day of surgery medications reviewed with the patient.  Reproductive/Obstetrics                             Anesthesia Physical Anesthesia Plan  ASA: II  Anesthesia Plan: General   Post-op Pain Management:  Regional for Post-op pain   Induction: Intravenous  PONV Risk Score and Plan: 2 and Midazolam, Dexamethasone and Ondansetron  Airway Management Planned: LMA  Additional Equipment:   Intra-op Plan:   Post-operative Plan: Extubation in OR  Informed Consent: I have reviewed the patients History and Physical, chart, labs and discussed the procedure including the risks, benefits and alternatives for the proposed anesthesia with the patient or authorized representative who has indicated his/her understanding and acceptance.   Dental advisory given  Plan Discussed with: CRNA  Anesthesia Plan Comments:         Anesthesia Quick Evaluation

## 2017-12-23 NOTE — Progress Notes (Signed)
Assisted Dr. Turk with right, ultrasound guided, adductor canal block. Side rails up, monitors on throughout procedure. See vital signs in flow sheet. Tolerated Procedure well.  

## 2017-12-23 NOTE — Transfer of Care (Signed)
Immediate Anesthesia Transfer of Care Note  Patient: Zoe Morris  Procedure(s) Performed: RIGHT KNEE ARTHROSCOPY WITH ANTERIOR CRUCIATE LIGAMENT (ACL) REPAIR WITH ALLOGRAFT HAMSTRING GRAFT (Right Knee) RIGHT KNEE ARTHROSCOPY WITH MEDIAL MENISECTOMY (Right Knee)  Patient Location: PACU  Anesthesia Type:GA combined with regional for post-op pain  Level of Consciousness: awake and patient cooperative  Airway & Oxygen Therapy: Patient Spontanous Breathing and Patient connected to face mask oxygen  Post-op Assessment: Report given to RN and Post -op Vital signs reviewed and stable  Post vital signs: Reviewed and stable  Last Vitals:  Vitals Value Taken Time  BP    Temp    Pulse 111 12/23/2017  9:15 AM  Resp 14 12/23/2017  9:15 AM  SpO2 100 % 12/23/2017  9:15 AM  Vitals shown include unvalidated device data.  Last Pain:  Vitals:   12/23/17 0648  TempSrc: Oral  PainSc: 0-No pain         Complications: No apparent anesthesia complications

## 2017-12-23 NOTE — Anesthesia Procedure Notes (Signed)
Procedure Name: LMA Insertion Date/Time: 12/23/2017 9:42 AM Performed by: Marrianne Mood, CRNA Pre-anesthesia Checklist: Patient identified, Emergency Drugs available, Suction available, Patient being monitored and Timeout performed Patient Re-evaluated:Patient Re-evaluated prior to induction Oxygen Delivery Method: Circle system utilized Preoxygenation: Pre-oxygenation with 100% oxygen Induction Type: IV induction Ventilation: Mask ventilation without difficulty LMA: LMA inserted LMA Size: 4.0 Number of attempts: 1 Airway Equipment and Method: Bite block Placement Confirmation: positive ETCO2 Tube secured with: Tape Dental Injury: Teeth and Oropharynx as per pre-operative assessment

## 2017-12-23 NOTE — Interval H&P Note (Signed)
History and Physical Interval Note:  12/23/2017 7:04 AM  Zoe Morris  has presented today for surgery, with the diagnosis of RIGHT KNEE ANTERIOR CRUCIATE LIGAMENT SPRAIN, RIGHT MEDIAL MENISCAL TEAR  The various methods of treatment have been discussed with the patient and family. After consideration of risks, benefits and other options for treatment, the patient has consented to  Procedure(s): RIGHT KNEE ARTHROSCOPY WITH ANTERIOR CRUCIATE LIGAMENT (ACL) REPAIR WITH HAMSTRING GRAFT (Right) RIGHT KNEE ARTHROSCOPY WITH MEDIAL MENISECTOMY (Right) as a surgical intervention .  The patient's history has been reviewed, patient examined, no change in status, stable for surgery.  I have reviewed the patient's chart and labs.  Questions were answered to the patient's satisfaction.     [name]

## 2017-12-24 ENCOUNTER — Encounter (HOSPITAL_BASED_OUTPATIENT_CLINIC_OR_DEPARTMENT_OTHER): Payer: Self-pay | Admitting: Orthopedic Surgery

## 2017-12-24 DIAGNOSIS — S83509A Sprain of unspecified cruciate ligament of unspecified knee, initial encounter: Secondary | ICD-10-CM | POA: Diagnosis not present

## 2017-12-24 DIAGNOSIS — S83249A Other tear of medial meniscus, current injury, unspecified knee, initial encounter: Secondary | ICD-10-CM | POA: Diagnosis not present

## 2017-12-24 NOTE — Op Note (Signed)
NAME: Zoe Morris, Zoe Morris MEDICAL RECORD QQ:[id number] ACCOUNT [id number] DATE OF BIRTH:09/17/1975 FACILITY: [facility] LOCATION: [facility] PHYSICIAN:ROBERT Venetia Maxon, MD  OPERATIVE REPORT  DATE OF PROCEDURE:  12/23/2017  PREOPERATIVE DIAGNOSES: 1.  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 2.  Right knee acute traumatic medial and lateral meniscal tears. 3.  Right knee chondromalacia.  POSTOPERATIVE DIAGNOSES: 1.  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 2.  Right knee acute traumatic medial and lateral meniscal tears. 3.  Right knee chondromalacia.  PROCEDURE: 1.  Right knee EUA, followed by arthroscopically assisted hamstring allograft anterior cruciate ligament reconstruction using Arthrex femoral TightRope with Arthrex SwiveLock. 2.  Right knee partial medial and lateral meniscectomies with chondroplasty.  SURGEON:  Elsie Saas, MD  ASSISTANT:  Matthew Saras, PA  ANESTHESIA:  General.  OPERATIVE TIME:  1 hour and 15 minutes.  COMPLICATIONS:  None.  INDICATION FOR PROCEDURE:  The patient is a 42 year old who has had significant pain with a new twisting injury that occurred approximately 2 weeks ago with a new acute traumatic bucket-handle medial meniscus tear and lateral meniscus tear with  underlying chronic anterior cruciate ligament deficiency.  She has failed conservative care and is now to undergo arthroscopy with ACL reconstruction and attention to her meniscal pathology.  DESCRIPTION OF PROCEDURE:  The patient was brought to the operating room on 12/23/2017 after an adductor canal block was placed in the holding room by anesthesia.  She was placed on the operating table in supine position.  She received antibiotics  preoperatively for prophylaxis.  After being placed under general anesthesia, her right knee was examined.  She had full range of motion, 3+ Lachman, positive pivot shift.  Knee stable to varus, valgus, and  posterior stress with normal patellar tracking.   The knee was sterilely injected with 0.25% Marcaine with epinephrine.  The right leg was then prepped using sterile DuraPrep and draped using sterile technique.  Time-out procedure was called, and the correct right knee identified.  Initially through  an anterolateral portal, the arthroscope with a pump attached was placed in through an anteromedial portal, and arthroscopic probe was placed.  On initial inspection of the medial compartment, she had 25% grade III chondromalacia, which was debrided.   She had a bucket-handle tear of the posterior medial horn of the medial meniscus of which this was resected leaving 50% of the posterior medial horn intact.  Intercondylar notch showed chronic ACL deficiency, and this area was debrided and a notchplasty  was performed.  The posterior cruciate was intact and stable.  Lateral compartment inspected.  The articular cartilage showed 30-40% grade III chondromalacia, which was debrided.  The lateral meniscus showed a tear of the posterior horn, 20%, which was  resected back to a stable rim.  The patellofemoral joint showed 50% grade III chondromalacia on the patella, which was debrided.  Femoral groove articular cartilage was intact.  The patella tracked normally.  Medial and lateral gutters were free of  pathology.  At this point, Matthew Saras, whose surgical and medical assistance was absolutely surgically and medically necessary, prepared the ACL graft on the back table while I prepared the inside of the knee to accept this graft.  Using an  Arthrex 9.5 mm tibial FlipCutter and through a 3 cm anteromedial proximal tibial incision, the tibial tunnel was prepared in the anatomic position on the tibial plateau.  Through this tibial tunnel, the posterior femoral guide was placed in the posterior  femoral notch and a  Steinmann pin was drilled up into the ACL origin point and then overdrilled with a 9 mm drill to a  depth of 20 mm, leaving a posterior 2 mm bone bridge.  A double-pin passer was then brought up through the tibial tunnel and joint and  up through the femoral tunnel and through the femoral cortex and thigh through a stab wound.  This was used to pass the Arthrex graft and TightRope up through the tibial tunnel and joint and up into the femoral tunnel.  The TightRope was then deployed  on the lateral femoral cortex and confirmed with intraoperative fluoroscopy.  The femoral end of the graft was then deployed up into the femoral tunnel with excellent fixation.  The knee was brought through a full range of motion.  There was found to be  no impingement of the graft.  The tibial end of the graft was then secured with Arthrex SwiveLock while Kirstin Shepperson held the tibia reduced on the femur in 30 degrees of flexion.  At this point, the knee was tested for stability.  Lachman and pivot  shift were found to be totally eliminated, and the knee could be brought through a full range of motion with no impingement of the graft.  At this point, I felt that all pathology had been satisfactorily addressed.  The instruments were removed.  The  portals were closed with 3-0 nylon suture.  Sterile dressings were applied after the anteromedial incision was closed with 2-0 Vicryl and 4-0 Prolene, and then after the dressings were applied, the patient awakened and taken to recovery room in stable  condition.  Needle and sponge count was correct x2 at the end of the case.  FOLLOWUP CARE:  The patient will be followed as an outpatient on oxycodone and Flexeril with a home CPM.  She will be seen back in the office in a week for sutures out and followup.  LN/NUANCE  D:12/24/2017 T:12/24/2017 JOB:[id number]

## 2017-12-30 DIAGNOSIS — M25461 Effusion, right knee: Secondary | ICD-10-CM | POA: Diagnosis not present

## 2017-12-30 DIAGNOSIS — R262 Difficulty in walking, not elsewhere classified: Secondary | ICD-10-CM | POA: Diagnosis not present

## 2017-12-30 DIAGNOSIS — M6281 Muscle weakness (generalized): Secondary | ICD-10-CM | POA: Diagnosis not present

## 2017-12-30 DIAGNOSIS — M25661 Stiffness of right knee, not elsewhere classified: Secondary | ICD-10-CM | POA: Diagnosis not present

## 2017-12-31 DIAGNOSIS — S83241D Other tear of medial meniscus, current injury, right knee, subsequent encounter: Secondary | ICD-10-CM | POA: Diagnosis not present

## 2017-12-31 DIAGNOSIS — S83281D Other tear of lateral meniscus, current injury, right knee, subsequent encounter: Secondary | ICD-10-CM | POA: Diagnosis not present

## 2018-01-01 DIAGNOSIS — M25661 Stiffness of right knee, not elsewhere classified: Secondary | ICD-10-CM | POA: Diagnosis not present

## 2018-01-01 DIAGNOSIS — M25461 Effusion, right knee: Secondary | ICD-10-CM | POA: Diagnosis not present

## 2018-01-01 DIAGNOSIS — R262 Difficulty in walking, not elsewhere classified: Secondary | ICD-10-CM | POA: Diagnosis not present

## 2018-01-01 DIAGNOSIS — M6281 Muscle weakness (generalized): Secondary | ICD-10-CM | POA: Diagnosis not present

## 2018-01-05 DIAGNOSIS — M25461 Effusion, right knee: Secondary | ICD-10-CM | POA: Diagnosis not present

## 2018-01-05 DIAGNOSIS — R262 Difficulty in walking, not elsewhere classified: Secondary | ICD-10-CM | POA: Diagnosis not present

## 2018-01-05 DIAGNOSIS — M6281 Muscle weakness (generalized): Secondary | ICD-10-CM | POA: Diagnosis not present

## 2018-01-05 DIAGNOSIS — M25661 Stiffness of right knee, not elsewhere classified: Secondary | ICD-10-CM | POA: Diagnosis not present

## 2018-01-07 DIAGNOSIS — M25661 Stiffness of right knee, not elsewhere classified: Secondary | ICD-10-CM | POA: Diagnosis not present

## 2018-01-07 DIAGNOSIS — R262 Difficulty in walking, not elsewhere classified: Secondary | ICD-10-CM | POA: Diagnosis not present

## 2018-01-07 DIAGNOSIS — M6281 Muscle weakness (generalized): Secondary | ICD-10-CM | POA: Diagnosis not present

## 2018-01-07 DIAGNOSIS — M25461 Effusion, right knee: Secondary | ICD-10-CM | POA: Diagnosis not present

## 2018-01-14 DIAGNOSIS — M25661 Stiffness of right knee, not elsewhere classified: Secondary | ICD-10-CM | POA: Diagnosis not present

## 2018-01-14 DIAGNOSIS — R262 Difficulty in walking, not elsewhere classified: Secondary | ICD-10-CM | POA: Diagnosis not present

## 2018-01-14 DIAGNOSIS — M25461 Effusion, right knee: Secondary | ICD-10-CM | POA: Diagnosis not present

## 2018-01-14 DIAGNOSIS — M6281 Muscle weakness (generalized): Secondary | ICD-10-CM | POA: Diagnosis not present

## 2018-01-19 DIAGNOSIS — R262 Difficulty in walking, not elsewhere classified: Secondary | ICD-10-CM | POA: Diagnosis not present

## 2018-01-19 DIAGNOSIS — M25661 Stiffness of right knee, not elsewhere classified: Secondary | ICD-10-CM | POA: Diagnosis not present

## 2018-01-19 DIAGNOSIS — M6281 Muscle weakness (generalized): Secondary | ICD-10-CM | POA: Diagnosis not present

## 2018-01-19 DIAGNOSIS — M25461 Effusion, right knee: Secondary | ICD-10-CM | POA: Diagnosis not present

## 2018-01-21 DIAGNOSIS — M25461 Effusion, right knee: Secondary | ICD-10-CM | POA: Diagnosis not present

## 2018-01-21 DIAGNOSIS — M25661 Stiffness of right knee, not elsewhere classified: Secondary | ICD-10-CM | POA: Diagnosis not present

## 2018-01-21 DIAGNOSIS — R262 Difficulty in walking, not elsewhere classified: Secondary | ICD-10-CM | POA: Diagnosis not present

## 2018-01-21 DIAGNOSIS — M6281 Muscle weakness (generalized): Secondary | ICD-10-CM | POA: Diagnosis not present

## 2018-01-21 MED FILL — predniSONE 10 MG TABS: 10 | 12 days supply | Qty: 48 | Fill #0

## 2018-01-27 DIAGNOSIS — B009 Herpesviral infection, unspecified: Secondary | ICD-10-CM | POA: Diagnosis not present

## 2018-01-27 DIAGNOSIS — Z01411 Encounter for gynecological examination (general) (routine) with abnormal findings: Secondary | ICD-10-CM | POA: Diagnosis not present

## 2018-01-27 DIAGNOSIS — Z6834 Body mass index (BMI) 34.0-34.9, adult: Secondary | ICD-10-CM | POA: Diagnosis not present

## 2018-01-27 DIAGNOSIS — Z3040 Encounter for surveillance of contraceptives, unspecified: Secondary | ICD-10-CM | POA: Diagnosis not present

## 2018-01-27 DIAGNOSIS — F17210 Nicotine dependence, cigarettes, uncomplicated: Secondary | ICD-10-CM | POA: Diagnosis not present

## 2018-01-27 DIAGNOSIS — Z1231 Encounter for screening mammogram for malignant neoplasm of breast: Secondary | ICD-10-CM | POA: Diagnosis not present

## 2018-01-27 DIAGNOSIS — Z124 Encounter for screening for malignant neoplasm of cervix: Secondary | ICD-10-CM | POA: Diagnosis not present

## 2018-01-27 DIAGNOSIS — Z113 Encounter for screening for infections with a predominantly sexual mode of transmission: Secondary | ICD-10-CM | POA: Diagnosis not present

## 2018-01-27 DIAGNOSIS — D259 Leiomyoma of uterus, unspecified: Secondary | ICD-10-CM | POA: Diagnosis not present

## 2018-02-02 DIAGNOSIS — M6281 Muscle weakness (generalized): Secondary | ICD-10-CM | POA: Diagnosis not present

## 2018-02-02 DIAGNOSIS — M25661 Stiffness of right knee, not elsewhere classified: Secondary | ICD-10-CM | POA: Diagnosis not present

## 2018-02-02 DIAGNOSIS — M25461 Effusion, right knee: Secondary | ICD-10-CM | POA: Diagnosis not present

## 2018-02-02 DIAGNOSIS — R262 Difficulty in walking, not elsewhere classified: Secondary | ICD-10-CM | POA: Diagnosis not present

## 2018-02-04 DIAGNOSIS — R262 Difficulty in walking, not elsewhere classified: Secondary | ICD-10-CM | POA: Diagnosis not present

## 2018-02-04 DIAGNOSIS — M25461 Effusion, right knee: Secondary | ICD-10-CM | POA: Diagnosis not present

## 2018-02-04 DIAGNOSIS — M25661 Stiffness of right knee, not elsewhere classified: Secondary | ICD-10-CM | POA: Diagnosis not present

## 2018-02-04 DIAGNOSIS — M6281 Muscle weakness (generalized): Secondary | ICD-10-CM | POA: Diagnosis not present

## 2018-02-09 DIAGNOSIS — M6281 Muscle weakness (generalized): Secondary | ICD-10-CM | POA: Diagnosis not present

## 2018-02-09 DIAGNOSIS — M25661 Stiffness of right knee, not elsewhere classified: Secondary | ICD-10-CM | POA: Diagnosis not present

## 2018-02-09 DIAGNOSIS — R262 Difficulty in walking, not elsewhere classified: Secondary | ICD-10-CM | POA: Diagnosis not present

## 2018-02-09 DIAGNOSIS — M25461 Effusion, right knee: Secondary | ICD-10-CM | POA: Diagnosis not present

## 2018-02-11 DIAGNOSIS — M25461 Effusion, right knee: Secondary | ICD-10-CM | POA: Diagnosis not present

## 2018-02-11 DIAGNOSIS — M6281 Muscle weakness (generalized): Secondary | ICD-10-CM | POA: Diagnosis not present

## 2018-02-11 DIAGNOSIS — M25661 Stiffness of right knee, not elsewhere classified: Secondary | ICD-10-CM | POA: Diagnosis not present

## 2018-02-11 DIAGNOSIS — R262 Difficulty in walking, not elsewhere classified: Secondary | ICD-10-CM | POA: Diagnosis not present

## 2018-02-16 DIAGNOSIS — M25661 Stiffness of right knee, not elsewhere classified: Secondary | ICD-10-CM | POA: Diagnosis not present

## 2018-02-16 DIAGNOSIS — M6281 Muscle weakness (generalized): Secondary | ICD-10-CM | POA: Diagnosis not present

## 2018-02-16 DIAGNOSIS — M25461 Effusion, right knee: Secondary | ICD-10-CM | POA: Diagnosis not present

## 2018-02-16 DIAGNOSIS — S83242A Other tear of medial meniscus, current injury, left knee, initial encounter: Secondary | ICD-10-CM | POA: Diagnosis not present

## 2018-02-16 DIAGNOSIS — R262 Difficulty in walking, not elsewhere classified: Secondary | ICD-10-CM | POA: Diagnosis not present

## 2018-02-16 MED FILL — MELOXICAM 15 MG TABLET: 15 | 30 days supply | Qty: 30 | Fill #0

## 2018-02-18 DIAGNOSIS — M6281 Muscle weakness (generalized): Secondary | ICD-10-CM | POA: Diagnosis not present

## 2018-02-18 DIAGNOSIS — M25661 Stiffness of right knee, not elsewhere classified: Secondary | ICD-10-CM | POA: Diagnosis not present

## 2018-02-18 DIAGNOSIS — M25461 Effusion, right knee: Secondary | ICD-10-CM | POA: Diagnosis not present

## 2018-02-18 DIAGNOSIS — R262 Difficulty in walking, not elsewhere classified: Secondary | ICD-10-CM | POA: Diagnosis not present

## 2018-02-23 DIAGNOSIS — R262 Difficulty in walking, not elsewhere classified: Secondary | ICD-10-CM | POA: Diagnosis not present

## 2018-02-23 DIAGNOSIS — M6281 Muscle weakness (generalized): Secondary | ICD-10-CM | POA: Diagnosis not present

## 2018-02-23 DIAGNOSIS — M25461 Effusion, right knee: Secondary | ICD-10-CM | POA: Diagnosis not present

## 2018-02-23 DIAGNOSIS — M25661 Stiffness of right knee, not elsewhere classified: Secondary | ICD-10-CM | POA: Diagnosis not present

## 2018-02-26 DIAGNOSIS — R262 Difficulty in walking, not elsewhere classified: Secondary | ICD-10-CM | POA: Diagnosis not present

## 2018-02-26 DIAGNOSIS — M6281 Muscle weakness (generalized): Secondary | ICD-10-CM | POA: Diagnosis not present

## 2018-02-26 DIAGNOSIS — M25461 Effusion, right knee: Secondary | ICD-10-CM | POA: Diagnosis not present

## 2018-02-26 DIAGNOSIS — M25661 Stiffness of right knee, not elsewhere classified: Secondary | ICD-10-CM | POA: Diagnosis not present

## 2018-03-02 DIAGNOSIS — R262 Difficulty in walking, not elsewhere classified: Secondary | ICD-10-CM | POA: Diagnosis not present

## 2018-03-02 DIAGNOSIS — M6281 Muscle weakness (generalized): Secondary | ICD-10-CM | POA: Diagnosis not present

## 2018-03-02 DIAGNOSIS — M25661 Stiffness of right knee, not elsewhere classified: Secondary | ICD-10-CM | POA: Diagnosis not present

## 2018-03-02 DIAGNOSIS — M25461 Effusion, right knee: Secondary | ICD-10-CM | POA: Diagnosis not present

## 2018-03-04 DIAGNOSIS — M6281 Muscle weakness (generalized): Secondary | ICD-10-CM | POA: Diagnosis not present

## 2018-03-04 DIAGNOSIS — R262 Difficulty in walking, not elsewhere classified: Secondary | ICD-10-CM | POA: Diagnosis not present

## 2018-03-04 DIAGNOSIS — M25661 Stiffness of right knee, not elsewhere classified: Secondary | ICD-10-CM | POA: Diagnosis not present

## 2018-03-04 DIAGNOSIS — M25461 Effusion, right knee: Secondary | ICD-10-CM | POA: Diagnosis not present

## 2018-03-08 DIAGNOSIS — M25661 Stiffness of right knee, not elsewhere classified: Secondary | ICD-10-CM | POA: Diagnosis not present

## 2018-03-08 DIAGNOSIS — R262 Difficulty in walking, not elsewhere classified: Secondary | ICD-10-CM | POA: Diagnosis not present

## 2018-03-08 DIAGNOSIS — M25461 Effusion, right knee: Secondary | ICD-10-CM | POA: Diagnosis not present

## 2018-03-11 DIAGNOSIS — R262 Difficulty in walking, not elsewhere classified: Secondary | ICD-10-CM | POA: Diagnosis not present

## 2018-03-11 DIAGNOSIS — M6281 Muscle weakness (generalized): Secondary | ICD-10-CM | POA: Diagnosis not present

## 2018-03-11 DIAGNOSIS — M25661 Stiffness of right knee, not elsewhere classified: Secondary | ICD-10-CM | POA: Diagnosis not present

## 2018-03-11 DIAGNOSIS — M25461 Effusion, right knee: Secondary | ICD-10-CM | POA: Diagnosis not present

## 2018-03-16 DIAGNOSIS — M25461 Effusion, right knee: Secondary | ICD-10-CM | POA: Diagnosis not present

## 2018-03-16 DIAGNOSIS — R262 Difficulty in walking, not elsewhere classified: Secondary | ICD-10-CM | POA: Diagnosis not present

## 2018-03-16 DIAGNOSIS — M25661 Stiffness of right knee, not elsewhere classified: Secondary | ICD-10-CM | POA: Diagnosis not present

## 2018-03-16 DIAGNOSIS — M6281 Muscle weakness (generalized): Secondary | ICD-10-CM | POA: Diagnosis not present

## 2018-03-18 DIAGNOSIS — M25461 Effusion, right knee: Secondary | ICD-10-CM | POA: Diagnosis not present

## 2018-03-18 DIAGNOSIS — R262 Difficulty in walking, not elsewhere classified: Secondary | ICD-10-CM | POA: Diagnosis not present

## 2018-03-18 DIAGNOSIS — M25661 Stiffness of right knee, not elsewhere classified: Secondary | ICD-10-CM | POA: Diagnosis not present

## 2018-03-18 DIAGNOSIS — M6281 Muscle weakness (generalized): Secondary | ICD-10-CM | POA: Diagnosis not present

## 2018-03-23 DIAGNOSIS — M25461 Effusion, right knee: Secondary | ICD-10-CM | POA: Diagnosis not present

## 2018-03-23 DIAGNOSIS — R262 Difficulty in walking, not elsewhere classified: Secondary | ICD-10-CM | POA: Diagnosis not present

## 2018-03-23 DIAGNOSIS — M25661 Stiffness of right knee, not elsewhere classified: Secondary | ICD-10-CM | POA: Diagnosis not present

## 2018-03-23 DIAGNOSIS — M6281 Muscle weakness (generalized): Secondary | ICD-10-CM | POA: Diagnosis not present

## 2018-04-01 DIAGNOSIS — M25661 Stiffness of right knee, not elsewhere classified: Secondary | ICD-10-CM | POA: Diagnosis not present

## 2018-04-01 DIAGNOSIS — M25461 Effusion, right knee: Secondary | ICD-10-CM | POA: Diagnosis not present

## 2018-04-01 DIAGNOSIS — R262 Difficulty in walking, not elsewhere classified: Secondary | ICD-10-CM | POA: Diagnosis not present

## 2018-04-01 DIAGNOSIS — M6281 Muscle weakness (generalized): Secondary | ICD-10-CM | POA: Diagnosis not present

## 2018-04-08 DIAGNOSIS — M6281 Muscle weakness (generalized): Secondary | ICD-10-CM | POA: Diagnosis not present

## 2018-04-08 DIAGNOSIS — R262 Difficulty in walking, not elsewhere classified: Secondary | ICD-10-CM | POA: Diagnosis not present

## 2018-04-08 DIAGNOSIS — M25461 Effusion, right knee: Secondary | ICD-10-CM | POA: Diagnosis not present

## 2018-04-08 DIAGNOSIS — M25661 Stiffness of right knee, not elsewhere classified: Secondary | ICD-10-CM | POA: Diagnosis not present

## 2018-04-12 ENCOUNTER — Ambulatory Visit (INDEPENDENT_AMBULATORY_CARE_PROVIDER_SITE_OTHER): Payer: Self-pay | Admitting: Family Medicine

## 2018-04-12 VITALS — BP 125/88 | HR 94 | Temp 97.8°F | Resp 16 | Wt 186.2 lb

## 2018-04-12 DIAGNOSIS — N390 Urinary tract infection, site not specified: Secondary | ICD-10-CM

## 2018-04-12 DIAGNOSIS — R319 Hematuria, unspecified: Secondary | ICD-10-CM

## 2018-04-12 DIAGNOSIS — R300 Dysuria: Secondary | ICD-10-CM

## 2018-04-12 LAB — POCT URINALYSIS DIPSTICK
Bilirubin, UA: NEGATIVE
Glucose, UA: NEGATIVE
Ketones, UA: NEGATIVE
Nitrite, UA: NEGATIVE
Protein, UA: NEGATIVE
Spec Grav, UA: 1.01 (ref 1.010–1.025)
Urobilinogen, UA: 0.2 E.U./dL
pH, UA: 5.5 (ref 5.0–8.0)

## 2018-04-12 MED ORDER — PHENAZOPYRIDINE HCL 200 MG PO TABS: 200.0000 mg | ORAL_TABLET | Freq: Three times a day (TID) | ORAL | 0 refills | Status: DC | PRN

## 2018-04-12 MED ORDER — NITROFURANTOIN MONOHYD MACRO 100 MG PO CAPS: 100.0000 mg | ORAL_CAPSULE | Freq: Two times a day (BID) | ORAL | 0 refills | Status: AC

## 2018-04-12 NOTE — Patient Instructions (Signed)

## 2018-04-12 NOTE — Progress Notes (Signed)
  Zoe Morris is a 42 y.o. female who presents today with concerns of increased urinary burning, frequency and blood in her urine for the past 2 days. She denies any discharge or odor but does report using a new soap in her vaginal area. She reports that she is historically sensitive in this area. Sh denies any risk for STI/STD or recent or related chronic illness.  Review of Systems  Constitutional: Negative for chills, fever and malaise/fatigue.  HENT: Negative for congestion, ear discharge, ear pain, sinus pain and sore throat.   Eyes: Negative.   Respiratory: Negative for cough, sputum production and shortness of breath.   Cardiovascular: Negative.  Negative for chest pain.  Gastrointestinal: Negative for abdominal pain, diarrhea, nausea and vomiting.  Genitourinary: Positive for dysuria, frequency, hematuria and urgency.  Musculoskeletal: Negative for myalgias.  Skin: Negative.   Neurological: Negative for headaches.  Endo/Heme/Allergies: Negative.   Psychiatric/Behavioral: Negative.     O: Vitals:   04/12/18 1831  BP: 125/88  Pulse: 94  Resp: 16  Temp: 97.8 F (36.6 C)  SpO2: 100%     Physical Exam  Constitutional: She is oriented to person, place, and time. Vital signs are normal. She appears well-developed and well-nourished. She is active.  Non-toxic appearance. She does not have a sickly appearance.  HENT:  Head: Normocephalic.  Right Ear: Hearing, tympanic membrane, external ear and ear canal normal.  Left Ear: Hearing, tympanic membrane, external ear and ear canal normal.  Nose: Nose normal.  Mouth/Throat: Uvula is midline and oropharynx is clear and moist.  Neck: Normal range of motion. Neck supple.  Cardiovascular: Normal rate, regular rhythm, normal heart sounds and normal pulses.  Pulmonary/Chest: Effort normal and breath sounds normal.  Abdominal: Soft. Bowel sounds are normal. There is tenderness in the suprapubic area. There is CVA tenderness. There  is no rigidity, no rebound and no guarding.  Mild flank pain and suprapubic pain on right side only  Musculoskeletal: Normal range of motion.  Lymphadenopathy:       Head (right side): No submental and no submandibular adenopathy present.       Head (left side): No submental and no submandibular adenopathy present.    She has no cervical adenopathy.  Neurological: She is alert and oriented to person, place, and time.  Psychiatric: She has a normal mood and affect.  Vitals reviewed.  A: 1. Dysuria   2. Urinary tract infection with hematuria, site unspecified    P: Discussed exam findings, diagnosis etiology and medication use and indications reviewed with patient. Follow- Up and discharge instructions provided. No emergent/urgent issues found on exam.  Patient verbalized understanding of information provided and agrees with plan of care (POC), all questions answered.  1. Dysuria - POCT urinalysis dipstick - phenazopyridine (PYRIDIUM) 200 MG tablet; Take 1 tablet (200 mg total) by mouth 3 (three) times daily as needed for pain.  2. Urinary tract infection with hematuria, site unspecified - nitrofurantoin, macrocrystal-monohydrate, (MACROBID) 100 MG capsule; Take 1 capsule (100 mg total) by mouth 2 (two) times daily for 7 days.  Other orders - escitalopram (LEXAPRO) 10 MG tablet; escitalopram 10 mg tablet - meloxicam (MOBIC) 15 MG tablet; TAKE 1 TABLET BY MOUTH ONCE DAILY WITH FOOD AS NEEDED FOR PAIN & SWELLING

## 2018-06-15 DIAGNOSIS — M9903 Segmental and somatic dysfunction of lumbar region: Secondary | ICD-10-CM | POA: Diagnosis not present

## 2018-06-15 DIAGNOSIS — M9905 Segmental and somatic dysfunction of pelvic region: Secondary | ICD-10-CM | POA: Diagnosis not present

## 2018-06-15 DIAGNOSIS — M9906 Segmental and somatic dysfunction of lower extremity: Secondary | ICD-10-CM | POA: Diagnosis not present

## 2018-06-15 DIAGNOSIS — M9902 Segmental and somatic dysfunction of thoracic region: Secondary | ICD-10-CM | POA: Diagnosis not present

## 2018-06-17 DIAGNOSIS — M9906 Segmental and somatic dysfunction of lower extremity: Secondary | ICD-10-CM | POA: Diagnosis not present

## 2018-06-17 DIAGNOSIS — M9905 Segmental and somatic dysfunction of pelvic region: Secondary | ICD-10-CM | POA: Diagnosis not present

## 2018-06-17 DIAGNOSIS — M9902 Segmental and somatic dysfunction of thoracic region: Secondary | ICD-10-CM | POA: Diagnosis not present

## 2018-06-17 DIAGNOSIS — M9903 Segmental and somatic dysfunction of lumbar region: Secondary | ICD-10-CM | POA: Diagnosis not present

## 2018-06-22 DIAGNOSIS — M9902 Segmental and somatic dysfunction of thoracic region: Secondary | ICD-10-CM | POA: Diagnosis not present

## 2018-06-22 DIAGNOSIS — M9903 Segmental and somatic dysfunction of lumbar region: Secondary | ICD-10-CM | POA: Diagnosis not present

## 2018-06-22 DIAGNOSIS — M9906 Segmental and somatic dysfunction of lower extremity: Secondary | ICD-10-CM | POA: Diagnosis not present

## 2018-06-22 DIAGNOSIS — M9905 Segmental and somatic dysfunction of pelvic region: Secondary | ICD-10-CM | POA: Diagnosis not present

## 2018-06-24 DIAGNOSIS — M9902 Segmental and somatic dysfunction of thoracic region: Secondary | ICD-10-CM | POA: Diagnosis not present

## 2018-06-24 DIAGNOSIS — M9905 Segmental and somatic dysfunction of pelvic region: Secondary | ICD-10-CM | POA: Diagnosis not present

## 2018-06-24 DIAGNOSIS — M9903 Segmental and somatic dysfunction of lumbar region: Secondary | ICD-10-CM | POA: Diagnosis not present

## 2018-06-24 DIAGNOSIS — M9906 Segmental and somatic dysfunction of lower extremity: Secondary | ICD-10-CM | POA: Diagnosis not present

## 2018-06-29 DIAGNOSIS — M9903 Segmental and somatic dysfunction of lumbar region: Secondary | ICD-10-CM | POA: Diagnosis not present

## 2018-06-29 DIAGNOSIS — M9902 Segmental and somatic dysfunction of thoracic region: Secondary | ICD-10-CM | POA: Diagnosis not present

## 2018-06-29 DIAGNOSIS — M9906 Segmental and somatic dysfunction of lower extremity: Secondary | ICD-10-CM | POA: Diagnosis not present

## 2018-06-29 DIAGNOSIS — M9905 Segmental and somatic dysfunction of pelvic region: Secondary | ICD-10-CM | POA: Diagnosis not present

## 2018-07-01 DIAGNOSIS — M9902 Segmental and somatic dysfunction of thoracic region: Secondary | ICD-10-CM | POA: Diagnosis not present

## 2018-07-01 DIAGNOSIS — M9906 Segmental and somatic dysfunction of lower extremity: Secondary | ICD-10-CM | POA: Diagnosis not present

## 2018-07-01 DIAGNOSIS — M9905 Segmental and somatic dysfunction of pelvic region: Secondary | ICD-10-CM | POA: Diagnosis not present

## 2018-07-01 DIAGNOSIS — M9903 Segmental and somatic dysfunction of lumbar region: Secondary | ICD-10-CM | POA: Diagnosis not present

## 2018-08-09 DIAGNOSIS — M9902 Segmental and somatic dysfunction of thoracic region: Secondary | ICD-10-CM | POA: Diagnosis not present

## 2018-08-09 DIAGNOSIS — M9906 Segmental and somatic dysfunction of lower extremity: Secondary | ICD-10-CM | POA: Diagnosis not present

## 2018-08-09 DIAGNOSIS — M9905 Segmental and somatic dysfunction of pelvic region: Secondary | ICD-10-CM | POA: Diagnosis not present

## 2018-08-09 DIAGNOSIS — M9903 Segmental and somatic dysfunction of lumbar region: Secondary | ICD-10-CM | POA: Diagnosis not present

## 2019-02-03 DIAGNOSIS — Z6834 Body mass index (BMI) 34.0-34.9, adult: Secondary | ICD-10-CM | POA: Diagnosis not present

## 2019-02-03 DIAGNOSIS — Z1231 Encounter for screening mammogram for malignant neoplasm of breast: Secondary | ICD-10-CM | POA: Diagnosis not present

## 2019-02-03 DIAGNOSIS — Z124 Encounter for screening for malignant neoplasm of cervix: Secondary | ICD-10-CM | POA: Diagnosis not present

## 2019-02-03 DIAGNOSIS — Z01419 Encounter for gynecological examination (general) (routine) without abnormal findings: Secondary | ICD-10-CM | POA: Diagnosis not present

## 2019-02-03 DIAGNOSIS — Z113 Encounter for screening for infections with a predominantly sexual mode of transmission: Secondary | ICD-10-CM | POA: Diagnosis not present

## 2019-02-03 DIAGNOSIS — M25562 Pain in left knee: Secondary | ICD-10-CM | POA: Diagnosis not present

## 2019-02-21 MED FILL — metroNIDAZOLE 500 MG TABS: 500 | 1 days supply | Qty: 4 | Fill #0

## 2019-02-24 DIAGNOSIS — R350 Frequency of micturition: Secondary | ICD-10-CM | POA: Diagnosis not present

## 2019-02-24 DIAGNOSIS — Z202 Contact with and (suspected) exposure to infections with a predominantly sexual mode of transmission: Secondary | ICD-10-CM | POA: Diagnosis not present

## 2019-02-24 DIAGNOSIS — R030 Elevated blood-pressure reading, without diagnosis of hypertension: Secondary | ICD-10-CM | POA: Diagnosis not present

## 2019-03-07 DIAGNOSIS — E6609 Other obesity due to excess calories: Secondary | ICD-10-CM | POA: Diagnosis not present

## 2019-03-07 DIAGNOSIS — R072 Precordial pain: Secondary | ICD-10-CM | POA: Diagnosis not present

## 2019-03-07 DIAGNOSIS — M25552 Pain in left hip: Secondary | ICD-10-CM | POA: Diagnosis not present

## 2019-03-07 DIAGNOSIS — R002 Palpitations: Secondary | ICD-10-CM | POA: Diagnosis not present

## 2019-03-10 DIAGNOSIS — M25661 Stiffness of right knee, not elsewhere classified: Secondary | ICD-10-CM | POA: Diagnosis not present

## 2019-03-10 DIAGNOSIS — M25561 Pain in right knee: Secondary | ICD-10-CM | POA: Diagnosis not present

## 2019-03-10 DIAGNOSIS — M25562 Pain in left knee: Secondary | ICD-10-CM | POA: Diagnosis not present

## 2019-03-14 DIAGNOSIS — E876 Hypokalemia: Secondary | ICD-10-CM | POA: Diagnosis not present

## 2019-03-14 DIAGNOSIS — E559 Vitamin D deficiency, unspecified: Secondary | ICD-10-CM | POA: Diagnosis not present

## 2019-03-14 DIAGNOSIS — E034 Atrophy of thyroid (acquired): Secondary | ICD-10-CM | POA: Diagnosis not present

## 2019-03-14 DIAGNOSIS — D649 Anemia, unspecified: Secondary | ICD-10-CM | POA: Diagnosis not present

## 2019-03-14 DIAGNOSIS — Z79899 Other long term (current) drug therapy: Secondary | ICD-10-CM | POA: Diagnosis not present

## 2019-03-14 DIAGNOSIS — E7849 Other hyperlipidemia: Secondary | ICD-10-CM | POA: Diagnosis not present

## 2019-04-07 DIAGNOSIS — I425 Other restrictive cardiomyopathy: Secondary | ICD-10-CM | POA: Diagnosis not present

## 2019-04-07 DIAGNOSIS — R072 Precordial pain: Secondary | ICD-10-CM | POA: Diagnosis not present

## 2019-04-07 DIAGNOSIS — R002 Palpitations: Secondary | ICD-10-CM | POA: Diagnosis not present

## 2019-04-07 DIAGNOSIS — E6609 Other obesity due to excess calories: Secondary | ICD-10-CM | POA: Diagnosis not present

## 2019-04-07 MED FILL — dilTIAZem HCL 30 MG TABS: 30 | 30 days supply | Qty: 60 | Fill #0

## 2019-05-02 ENCOUNTER — Other Ambulatory Visit: Payer: Self-pay | Admitting: *Deleted

## 2019-05-02 ENCOUNTER — Other Ambulatory Visit: Payer: 59 | Admitting: *Deleted

## 2019-05-02 DIAGNOSIS — R002 Palpitations: Secondary | ICD-10-CM

## 2019-05-02 DIAGNOSIS — E119 Type 2 diabetes mellitus without complications: Secondary | ICD-10-CM

## 2019-05-02 NOTE — Progress Notes (Signed)
Labs for patient who is scheduling for new patient appointment with Dr. Harrington Challenger. Ordered per Dr. Harrington Challenger.

## 2019-05-03 LAB — BASIC METABOLIC PANEL
BUN/Creatinine Ratio: 12 (ref 9–23)
BUN: 13 mg/dL (ref 6–24)
CO2: 21 mmol/L (ref 20–29)
Calcium: 8.9 mg/dL (ref 8.7–10.2)
Chloride: 105 mmol/L (ref 96–106)
Creatinine, Ser: 1.1 mg/dL — ABNORMAL HIGH (ref 0.57–1.00)
GFR calc Af Amer: 70 mL/min/{1.73_m2} (ref >59)
GFR calc non Af Amer: 61 mL/min/{1.73_m2} (ref >59)
Glucose: 85 mg/dL (ref 65–99)
Potassium: 4.1 mmol/L (ref 3.5–5.2)
Sodium: 140 mmol/L (ref 134–144)

## 2019-05-03 LAB — CBC
Hematocrit: 39.6 % (ref 34.0–46.6)
Hemoglobin: 12.6 g/dL (ref 11.1–15.9)
MCH: 26.4 pg — ABNORMAL LOW (ref 26.6–33.0)
MCHC: 31.8 g/dL (ref 31.5–35.7)
MCV: 83 fL (ref 79–97)
Platelets: 288 10*3/uL (ref 150–450)
RBC: 4.78 x10E6/uL (ref 3.77–5.28)
RDW: 14.2 % (ref 11.7–15.4)
WBC: 10.5 10*3/uL (ref 3.4–10.8)

## 2019-05-03 LAB — HEMOGLOBIN A1C
Est. average glucose Bld gHb Est-mCnc: 120 mg/dL
Hgb A1c MFr Bld: 5.8 % — ABNORMAL HIGH (ref 4.8–5.6)

## 2019-05-03 LAB — LIPID PANEL
Chol/HDL Ratio: 2.9 ratio (ref 0.0–4.4)
Cholesterol, Total: 140 mg/dL (ref 100–199)
HDL: 48 mg/dL (ref >39)
LDL Chol Calc (NIH): 79 mg/dL (ref 0–99)
Triglycerides: 62 mg/dL (ref 0–149)
VLDL Cholesterol Cal: 13 mg/dL (ref 5–40)

## 2019-05-05 DIAGNOSIS — E6609 Other obesity due to excess calories: Secondary | ICD-10-CM | POA: Diagnosis not present

## 2019-05-05 DIAGNOSIS — I425 Other restrictive cardiomyopathy: Secondary | ICD-10-CM | POA: Diagnosis not present

## 2019-05-05 DIAGNOSIS — R072 Precordial pain: Secondary | ICD-10-CM | POA: Diagnosis not present

## 2019-05-05 DIAGNOSIS — M25552 Pain in left hip: Secondary | ICD-10-CM | POA: Diagnosis not present

## 2019-08-17 DIAGNOSIS — M25661 Stiffness of right knee, not elsewhere classified: Secondary | ICD-10-CM | POA: Diagnosis not present

## 2019-09-05 DIAGNOSIS — G8918 Other acute postprocedural pain: Secondary | ICD-10-CM | POA: Diagnosis not present

## 2019-09-05 DIAGNOSIS — M24661 Ankylosis, right knee: Secondary | ICD-10-CM | POA: Diagnosis not present

## 2019-09-05 MED FILL — oxyCODONE HCL 5 MG TABS: 5 | 5 days supply | Qty: 30 | Fill #0

## 2019-09-05 MED FILL — ONDANSETRON HCL 4 MG TABLET: 4 | 10 days supply | Qty: 20 | Fill #0

## 2019-09-13 DIAGNOSIS — M25661 Stiffness of right knee, not elsewhere classified: Secondary | ICD-10-CM | POA: Diagnosis not present

## 2019-09-13 DIAGNOSIS — M25561 Pain in right knee: Secondary | ICD-10-CM | POA: Diagnosis not present

## 2019-09-15 DIAGNOSIS — M25561 Pain in right knee: Secondary | ICD-10-CM | POA: Diagnosis not present

## 2019-09-15 DIAGNOSIS — M25661 Stiffness of right knee, not elsewhere classified: Secondary | ICD-10-CM | POA: Diagnosis not present

## 2019-09-19 DIAGNOSIS — M25561 Pain in right knee: Secondary | ICD-10-CM | POA: Diagnosis not present

## 2019-09-19 DIAGNOSIS — M25661 Stiffness of right knee, not elsewhere classified: Secondary | ICD-10-CM | POA: Diagnosis not present

## 2019-09-20 MED FILL — METHOCARBAMOL 500 MG TABS: 500 | 17 days supply | Qty: 50 | Fill #0

## 2019-09-21 DIAGNOSIS — M25561 Pain in right knee: Secondary | ICD-10-CM | POA: Diagnosis not present

## 2019-09-21 DIAGNOSIS — M25661 Stiffness of right knee, not elsewhere classified: Secondary | ICD-10-CM | POA: Diagnosis not present

## 2019-09-23 DIAGNOSIS — M25661 Stiffness of right knee, not elsewhere classified: Secondary | ICD-10-CM | POA: Diagnosis not present

## 2019-09-23 DIAGNOSIS — M25561 Pain in right knee: Secondary | ICD-10-CM | POA: Diagnosis not present

## 2019-09-28 DIAGNOSIS — M25561 Pain in right knee: Secondary | ICD-10-CM | POA: Diagnosis not present

## 2019-09-28 DIAGNOSIS — M25661 Stiffness of right knee, not elsewhere classified: Secondary | ICD-10-CM | POA: Diagnosis not present

## 2019-09-30 DIAGNOSIS — M25661 Stiffness of right knee, not elsewhere classified: Secondary | ICD-10-CM | POA: Diagnosis not present

## 2019-09-30 DIAGNOSIS — M25561 Pain in right knee: Secondary | ICD-10-CM | POA: Diagnosis not present

## 2019-10-03 DIAGNOSIS — M25561 Pain in right knee: Secondary | ICD-10-CM | POA: Diagnosis not present

## 2019-10-03 DIAGNOSIS — M25661 Stiffness of right knee, not elsewhere classified: Secondary | ICD-10-CM | POA: Diagnosis not present

## 2019-10-05 DIAGNOSIS — M25561 Pain in right knee: Secondary | ICD-10-CM | POA: Diagnosis not present

## 2019-10-05 DIAGNOSIS — M25661 Stiffness of right knee, not elsewhere classified: Secondary | ICD-10-CM | POA: Diagnosis not present

## 2019-10-07 DIAGNOSIS — M25661 Stiffness of right knee, not elsewhere classified: Secondary | ICD-10-CM | POA: Diagnosis not present

## 2019-10-07 DIAGNOSIS — M25561 Pain in right knee: Secondary | ICD-10-CM | POA: Diagnosis not present

## 2019-10-12 DIAGNOSIS — M25661 Stiffness of right knee, not elsewhere classified: Secondary | ICD-10-CM | POA: Diagnosis not present

## 2019-10-12 DIAGNOSIS — M25561 Pain in right knee: Secondary | ICD-10-CM | POA: Diagnosis not present

## 2019-10-14 DIAGNOSIS — M25661 Stiffness of right knee, not elsewhere classified: Secondary | ICD-10-CM | POA: Diagnosis not present

## 2019-10-14 DIAGNOSIS — M25561 Pain in right knee: Secondary | ICD-10-CM | POA: Diagnosis not present

## 2019-10-17 DIAGNOSIS — M25661 Stiffness of right knee, not elsewhere classified: Secondary | ICD-10-CM | POA: Diagnosis not present

## 2019-10-17 DIAGNOSIS — M25561 Pain in right knee: Secondary | ICD-10-CM | POA: Diagnosis not present

## 2019-10-19 DIAGNOSIS — M25661 Stiffness of right knee, not elsewhere classified: Secondary | ICD-10-CM | POA: Diagnosis not present

## 2019-10-19 DIAGNOSIS — M25561 Pain in right knee: Secondary | ICD-10-CM | POA: Diagnosis not present

## 2019-10-21 DIAGNOSIS — M25661 Stiffness of right knee, not elsewhere classified: Secondary | ICD-10-CM | POA: Diagnosis not present

## 2019-10-21 DIAGNOSIS — M25561 Pain in right knee: Secondary | ICD-10-CM | POA: Diagnosis not present

## 2019-10-26 DIAGNOSIS — M25661 Stiffness of right knee, not elsewhere classified: Secondary | ICD-10-CM | POA: Diagnosis not present

## 2019-10-26 DIAGNOSIS — M25561 Pain in right knee: Secondary | ICD-10-CM | POA: Diagnosis not present

## 2019-11-01 DIAGNOSIS — M25561 Pain in right knee: Secondary | ICD-10-CM | POA: Diagnosis not present

## 2019-11-01 DIAGNOSIS — M25661 Stiffness of right knee, not elsewhere classified: Secondary | ICD-10-CM | POA: Diagnosis not present

## 2019-11-09 DIAGNOSIS — M25661 Stiffness of right knee, not elsewhere classified: Secondary | ICD-10-CM | POA: Diagnosis not present

## 2019-11-09 DIAGNOSIS — M25561 Pain in right knee: Secondary | ICD-10-CM | POA: Diagnosis not present

## 2019-11-16 DIAGNOSIS — M25661 Stiffness of right knee, not elsewhere classified: Secondary | ICD-10-CM | POA: Diagnosis not present

## 2019-11-16 DIAGNOSIS — M25561 Pain in right knee: Secondary | ICD-10-CM | POA: Diagnosis not present

## 2019-11-24 DIAGNOSIS — M25561 Pain in right knee: Secondary | ICD-10-CM | POA: Diagnosis not present

## 2019-11-24 DIAGNOSIS — M25661 Stiffness of right knee, not elsewhere classified: Secondary | ICD-10-CM | POA: Diagnosis not present

## 2019-11-30 DIAGNOSIS — M25561 Pain in right knee: Secondary | ICD-10-CM | POA: Diagnosis not present

## 2019-11-30 DIAGNOSIS — M25661 Stiffness of right knee, not elsewhere classified: Secondary | ICD-10-CM | POA: Diagnosis not present

## 2019-12-05 DIAGNOSIS — Z4789 Encounter for other orthopedic aftercare: Secondary | ICD-10-CM | POA: Diagnosis not present

## 2020-02-20 DIAGNOSIS — Z01419 Encounter for gynecological examination (general) (routine) without abnormal findings: Secondary | ICD-10-CM | POA: Diagnosis not present

## 2020-02-20 DIAGNOSIS — Z1239 Encounter for other screening for malignant neoplasm of breast: Secondary | ICD-10-CM | POA: Diagnosis not present

## 2020-02-20 DIAGNOSIS — Z113 Encounter for screening for infections with a predominantly sexual mode of transmission: Secondary | ICD-10-CM | POA: Diagnosis not present

## 2020-02-20 DIAGNOSIS — Z1231 Encounter for screening mammogram for malignant neoplasm of breast: Secondary | ICD-10-CM | POA: Diagnosis not present

## 2020-02-23 ENCOUNTER — Other Ambulatory Visit: Payer: Self-pay

## 2020-02-24 ENCOUNTER — Ambulatory Visit: Payer: 59 | Admitting: Internal Medicine

## 2020-02-28 ENCOUNTER — Other Ambulatory Visit: Payer: Self-pay

## 2020-02-28 ENCOUNTER — Encounter: Payer: Self-pay | Admitting: Internal Medicine

## 2020-02-28 ENCOUNTER — Ambulatory Visit (INDEPENDENT_AMBULATORY_CARE_PROVIDER_SITE_OTHER): Payer: 59 | Admitting: Internal Medicine

## 2020-02-28 VITALS — BP 120/90 | HR 91 | Temp 98.5°F | Ht 62.0 in | Wt 194.9 lb

## 2020-02-28 DIAGNOSIS — Z720 Tobacco use: Secondary | ICD-10-CM

## 2020-02-28 DIAGNOSIS — E559 Vitamin D deficiency, unspecified: Secondary | ICD-10-CM | POA: Insufficient documentation

## 2020-02-28 DIAGNOSIS — E669 Obesity, unspecified: Secondary | ICD-10-CM | POA: Diagnosis not present

## 2020-02-28 DIAGNOSIS — Z0000 Encounter for general adult medical examination without abnormal findings: Secondary | ICD-10-CM | POA: Diagnosis not present

## 2020-02-28 DIAGNOSIS — R030 Elevated blood-pressure reading, without diagnosis of hypertension: Secondary | ICD-10-CM

## 2020-02-28 DIAGNOSIS — F17210 Nicotine dependence, cigarettes, uncomplicated: Secondary | ICD-10-CM | POA: Diagnosis not present

## 2020-02-28 NOTE — Progress Notes (Signed)
Established Patient Office Visit     This visit occurred during the SARS-CoV-2 public health emergency.  Safety protocols were in place, including screening questions prior to the visit, additional usage of staff PPE, and extensive cleaning of exam room while observing appropriate contact time as indicated for disinfecting solutions.    CC/Reason for Visit: Establish care, annual preventive exam  HPI: Zoe Morris is a 44 y.o. female who is coming in today for the above mentioned reasons. Past Medical History is significant for: Vitamin D deficiency.  She works at the [employer] of a [employer], she has 4 children, youngest is age 22, she smokes occasionally 1 to 2 cigarettes a day for about 5 years, she drinks occasionally, she has no known drug allergies, her past surgical history is only significant for a tonsillectomy as a child and a recent right knee arthroscopic surgery due to a torn ACL.  Her family history significant for mother with COPD but she was a heavy smoker and a father with multiple myeloma.  She sees a GYN who just last week did her Pap smear and her mammogram.  She is overdue for Covid, flu.  She believes her Tdap is updated.  She has never had a colonoscopy.  She has routine eye and dental care.   Past Medical/Surgical History: Past Medical History:  Diagnosis Date  . Asthma   . Complete tear of right ACL   . Current tear knee, medial meniscus   . Palpitations     Past Surgical History:  Procedure Laterality Date  . KNEE ARTHROSCOPY WITH ANTERIOR CRUCIATE LIGAMENT (ACL) REPAIR WITH HAMSTRING GRAFT Right 12/23/2017   Procedure: RIGHT KNEE ARTHROSCOPY WITH ANTERIOR CRUCIATE LIGAMENT (ACL) REPAIR WITH ALLOGRAFT HAMSTRING GRAFT;  Surgeon: Elsie Saas, MD;  Location: [place];  Service: Orthopedics;  Laterality: Right;  . KNEE ARTHROSCOPY WITH MEDIAL MENISECTOMY Right 12/23/2017   Procedure: RIGHT KNEE ARTHROSCOPY WITH MEDIAL  MENISECTOMY;  Surgeon: Elsie Saas, MD;  Location: [place];  Service: Orthopedics;  Laterality: Right;    Social History:  reports that she has been smoking. She has never used smokeless tobacco. She reports current alcohol use. She reports that she does not use drugs.  Allergies: No Known Allergies  Family History:  Family History  Problem Relation Age of Onset  . COPD Mother   . Multiple myeloma Father     No current outpatient medications on file.  Review of Systems:  Constitutional: Denies fever, chills, diaphoresis, appetite change and fatigue.  HEENT: Denies photophobia, eye pain, redness, hearing loss, ear pain, congestion, sore throat, rhinorrhea, sneezing, mouth sores, trouble swallowing, neck pain, neck stiffness and tinnitus.   Respiratory: Denies SOB, DOE, cough, chest tightness,  and wheezing.   Cardiovascular: Denies chest pain, palpitations and leg swelling.  Gastrointestinal: Denies nausea, vomiting, abdominal pain, diarrhea, constipation, blood in stool and abdominal distention.  Genitourinary: Denies dysuria, urgency, frequency, hematuria, flank pain and difficulty urinating.  Endocrine: Denies: hot or cold intolerance, sweats, changes in hair or nails, polyuria, polydipsia. Musculoskeletal: Denies myalgias, back pain, joint swelling, arthralgias and gait problem.  Skin: Denies pallor, rash and wound.  Neurological: Denies dizziness, seizures, syncope, weakness, light-headedness, numbness and headaches.  Hematological: Denies adenopathy. Easy bruising, personal or family bleeding history  Psychiatric/Behavioral: Denies suicidal ideation, mood changes, confusion, nervousness, sleep disturbance and agitation    Physical Exam: Vitals:   02/28/20 0847  BP: 120/90  Pulse: 91  Temp: 98.5 F (  36.9 C)  TempSrc: Oral  SpO2: 97%  Weight: 194 lb 14.4 oz (88.4 kg)  Height: $Remove'5\' 2"'[id number]$  (1.575 m)    Body mass index is 35.65 kg/m.   Constitutional:  NAD, calm, comfortable Eyes: PERRL, lids and conjunctivae normal ENMT: Mucous membranes are moist. Neck: normal, supple, no masses, no thyromegaly Respiratory: clear to auscultation bilaterally, no wheezing, no crackles. Normal respiratory effort. No accessory muscle use.  Cardiovascular: Regular rate and rhythm, no murmurs / rubs / gallops. No extremity edema.  Abdomen: no tenderness, no masses palpated. No hepatosplenomegaly. Bowel sounds positive.  Musculoskeletal: no clubbing / cyanosis. No joint deformity upper and lower extremities. Good ROM, no contractures. Normal muscle tone.  Skin: no rashes, lesions, ulcers. No induration Neurologic: CN 2-12 grossly intact. Sensation intact, DTR normal. Strength 5/5 in all 4.  Psychiatric: Normal judgment and insight. Alert and oriented x 3. Normal mood.    Impression and Plan:  Encounter for preventive health examination  -Advised routine eye and dental care. -She is due for Covid, flu which she will receive at work.  She believes her Tdap is updated but does not have dates. -Screening labs today. -Healthy lifestyle discussed in detail. -She recently had her Pap smear and mammogram, she has not yet had a colonoscopy, GYN referral today.  Tobacco abuse -I have discussed tobacco cessation with the patient.  I have counseled the patient regarding the negative impacts of continued tobacco use including but not limited to lung cancer, COPD, and cardiovascular disease.  I have discussed alternatives to tobacco and modalities that may help facilitate tobacco cessation including but not limited to biofeedback, hypnosis, and medications.  Total time spent with tobacco counseling was 4 minutes. -She will think about this and we will discuss again at her next visit in 8 weeks.   Vitamin D deficiency  -Check vitamin D levels.  Elevated BP without diagnosis of hypertension -She will do ambulatory blood pressure monitoring and return in 8 weeks for  follow-up.  Obesity (BMI 35.0-39.9 without comorbidity) -Discussed healthy lifestyle, including increased physical activity and better food choices to promote weight loss.     Patient Instructions  -Nice seeing you today!!  -Lab work today; will notify you once results are available.  -Check your BP 2-3 times a week and bring measurements into your next visit.  -Schedule follow up in 8 weeks.   Preventive Care 17-76 Years Old, Female Preventive care refers to visits with your health care provider and lifestyle choices that can promote health and wellness. This includes:  A yearly physical exam. This may also be called an annual well check.  Regular dental visits and eye exams.  Immunizations.  Screening for certain conditions.  Healthy lifestyle choices, such as eating a healthy diet, getting regular exercise, not using drugs or products that contain nicotine and tobacco, and limiting alcohol use. What can I expect for my preventive care visit? Physical exam Your health care provider will check your:  Height and weight. This may be used to calculate body mass index (BMI), which tells if you are at a healthy weight.  Heart rate and blood pressure.  Skin for abnormal spots. Counseling Your health care provider may ask you questions about your:  Alcohol, tobacco, and drug use.  Emotional well-being.  Home and relationship well-being.  Sexual activity.  Eating habits.  Work and work Statistician.  Method of birth control.  Menstrual cycle.  Pregnancy history. What immunizations do I need?  Influenza (flu) vaccine  This is recommended every year. Tetanus, diphtheria, and pertussis (Tdap) vaccine  You may need a Td booster every 10 years. Varicella (chickenpox) vaccine  You may need this if you have not been vaccinated. Zoster (shingles) vaccine  You may need this after age 56. Measles, mumps, and rubella (MMR) vaccine  You may need at least one  dose of MMR if you were born in 1957 or later. You may also need a second dose. Pneumococcal conjugate (PCV13) vaccine  You may need this if you have certain conditions and were not previously vaccinated. Pneumococcal polysaccharide (PPSV23) vaccine  You may need one or two doses if you smoke cigarettes or if you have certain conditions. Meningococcal conjugate (MenACWY) vaccine  You may need this if you have certain conditions. Hepatitis A vaccine  You may need this if you have certain conditions or if you travel or work in places where you may be exposed to hepatitis A. Hepatitis B vaccine  You may need this if you have certain conditions or if you travel or work in places where you may be exposed to hepatitis B. Haemophilus influenzae type b (Hib) vaccine  You may need this if you have certain conditions. Human papillomavirus (HPV) vaccine  If recommended by your health care provider, you may need three doses over 6 months. You may receive vaccines as individual doses or as more than one vaccine together in one shot (combination vaccines). Talk with your health care provider about the risks and benefits of combination vaccines. What tests do I need? Blood tests  Lipid and cholesterol levels. These may be checked every 5 years, or more frequently if you are over 59 years old.  Hepatitis C test.  Hepatitis B test. Screening  Lung cancer screening. You may have this screening every year starting at age 79 if you have a 30-pack-year history of smoking and currently smoke or have quit within the past 15 years.  Colorectal cancer screening. All adults should have this screening starting at age 9 and continuing until age 59. Your health care provider may recommend screening at age 33 if you are at increased risk. You will have tests every 1-10 years, depending on your results and the type of screening test.  Diabetes screening. This is done by checking your blood sugar (glucose)  after you have not eaten for a while (fasting). You may have this done every 1-3 years.  Mammogram. This may be done every 1-2 years. Talk with your health care provider about when you should start having regular mammograms. This may depend on whether you have a family history of breast cancer.  BRCA-related cancer screening. This may be done if you have a family history of breast, ovarian, tubal, or peritoneal cancers.  Pelvic exam and Pap test. This may be done every 3 years starting at age 18. Starting at age 53, this may be done every 5 years if you have a Pap test in combination with an HPV test. Other tests  Sexually transmitted disease (STD) testing.  Bone density scan. This is done to screen for osteoporosis. You may have this scan if you are at high risk for osteoporosis. Follow these instructions at home: Eating and drinking  Eat a diet that includes fresh fruits and vegetables, whole grains, lean protein, and low-fat dairy.  Take vitamin and mineral supplements as recommended by your health care provider.  Do not drink alcohol if: ? Your health care provider tells you not to drink. ? You are  pregnant, may be pregnant, or are planning to become pregnant.  If you drink alcohol: ? Limit how much you have to 0-1 drink a day. ? Be aware of how much alcohol is in your drink. In the [place] one drink equals one 12 oz bottle of beer (355 mL), one 5 oz glass of wine (148 mL), or one 1 oz glass of hard liquor (44 mL). Lifestyle  Take daily care of your teeth and gums.  Stay active. Exercise for at least 30 minutes on 5 or more days each week.  Do not use any products that contain nicotine or tobacco, such as cigarettes, e-cigarettes, and chewing tobacco. If you need help quitting, ask your health care provider.  If you are sexually active, practice safe sex. Use a condom or other form of birth control (contraception) in order to prevent pregnancy and STIs (sexually transmitted  infections).  If told by your health care provider, take low-dose aspirin daily starting at age 36. What's next?  Visit your health care provider once a year for a well check visit.  Ask your health care provider how often you should have your eyes and teeth checked.  Stay up to date on all vaccines. This information is not intended to replace advice given to you by your health care provider. Make sure you discuss any questions you have with your health care provider. Document Revised: 01/21/2018 Document Reviewed: 01/21/2018 Elsevier Patient Education  2020 [place], [place] [facility]

## 2020-02-28 NOTE — Patient Instructions (Signed)
-Nice seeing you today!!  -Lab work today; will notify you once results are available.  -Check your BP 2-3 times a week and bring measurements into your next visit.  -Schedule follow up in 8 weeks.   Preventive Care 17-46 Years Old, Female Preventive care refers to visits with your health care provider and lifestyle choices that can promote health and wellness. This includes:  A yearly physical exam. This may also be called an annual well check.  Regular dental visits and eye exams.  Immunizations.  Screening for certain conditions.  Healthy lifestyle choices, such as eating a healthy diet, getting regular exercise, not using drugs or products that contain nicotine and tobacco, and limiting alcohol use. What can I expect for my preventive care visit? Physical exam Your health care provider will check your:  Height and weight. This may be used to calculate body mass index (BMI), which tells if you are at a healthy weight.  Heart rate and blood pressure.  Skin for abnormal spots. Counseling Your health care provider may ask you questions about your:  Alcohol, tobacco, and drug use.  Emotional well-being.  Home and relationship well-being.  Sexual activity.  Eating habits.  Work and work Statistician.  Method of birth control.  Menstrual cycle.  Pregnancy history. What immunizations do I need?  Influenza (flu) vaccine  This is recommended every year. Tetanus, diphtheria, and pertussis (Tdap) vaccine  You may need a Td booster every 10 years. Varicella (chickenpox) vaccine  You may need this if you have not been vaccinated. Zoster (shingles) vaccine  You may need this after age 39. Measles, mumps, and rubella (MMR) vaccine  You may need at least one dose of MMR if you were born in 1957 or later. You may also need a second dose. Pneumococcal conjugate (PCV13) vaccine  You may need this if you have certain conditions and were not previously  vaccinated. Pneumococcal polysaccharide (PPSV23) vaccine  You may need one or two doses if you smoke cigarettes or if you have certain conditions. Meningococcal conjugate (MenACWY) vaccine  You may need this if you have certain conditions. Hepatitis A vaccine  You may need this if you have certain conditions or if you travel or work in places where you may be exposed to hepatitis A. Hepatitis B vaccine  You may need this if you have certain conditions or if you travel or work in places where you may be exposed to hepatitis B. Haemophilus influenzae type b (Hib) vaccine  You may need this if you have certain conditions. Human papillomavirus (HPV) vaccine  If recommended by your health care provider, you may need three doses over 6 months. You may receive vaccines as individual doses or as more than one vaccine together in one shot (combination vaccines). Talk with your health care provider about the risks and benefits of combination vaccines. What tests do I need? Blood tests  Lipid and cholesterol levels. These may be checked every 5 years, or more frequently if you are over 28 years old.  Hepatitis C test.  Hepatitis B test. Screening  Lung cancer screening. You may have this screening every year starting at age 37 if you have a 30-pack-year history of smoking and currently smoke or have quit within the past 15 years.  Colorectal cancer screening. All adults should have this screening starting at age 70 and continuing until age 20. Your health care provider may recommend screening at age 78 if you are at increased risk. You will have  will have tests every 1-10 years, depending on your results and the type of screening test.  Diabetes screening. This is done by checking your blood sugar (glucose) after you have not eaten for a while (fasting). You may have this done every 1-3 years.  Mammogram. This may be done every 1-2 years. Talk with your health care provider about when you should start  having regular mammograms. This may depend on whether you have a family history of breast cancer.  BRCA-related cancer screening. This may be done if you have a family history of breast, ovarian, tubal, or peritoneal cancers.  Pelvic exam and Pap test. This may be done every 3 years starting at age 21. Starting at age 30, this may be done every 5 years if you have a Pap test in combination with an HPV test. Other tests  Sexually transmitted disease (STD) testing.  Bone density scan. This is done to screen for osteoporosis. You may have this scan if you are at high risk for osteoporosis. Follow these instructions at home: Eating and drinking  Eat a diet that includes fresh fruits and vegetables, whole grains, lean protein, and low-fat dairy.  Take vitamin and mineral supplements as recommended by your health care provider.  Do not drink alcohol if: ? Your health care provider tells you not to drink. ? You are pregnant, may be pregnant, or are planning to become pregnant.  If you drink alcohol: ? Limit how much you have to 0-1 drink a day. ? Be aware of how much alcohol is in your drink. In the [place] one drink equals one 12 oz bottle of beer (355 mL), one 5 oz glass of wine (148 mL), or one 1 oz glass of hard liquor (44 mL). Lifestyle  Take daily care of your teeth and gums.  Stay active. Exercise for at least 30 minutes on 5 or more days each week.  Do not use any products that contain nicotine or tobacco, such as cigarettes, e-cigarettes, and chewing tobacco. If you need help quitting, ask your health care provider.  If you are sexually active, practice safe sex. Use a condom or other form of birth control (contraception) in order to prevent pregnancy and STIs (sexually transmitted infections).  If told by your health care provider, take low-dose aspirin daily starting at age 50. What's next?  Visit your health care provider once a year for a well check visit.  Ask your health  care provider how often you should have your eyes and teeth checked.  Stay up to date on all vaccines. This information is not intended to replace advice given to you by your health care provider. Make sure you discuss any questions you have with your health care provider. Document Revised: 01/21/2018 Document Reviewed: 01/21/2018 Elsevier Patient Education  2020 Elsevier Inc.  

## 2020-02-29 ENCOUNTER — Other Ambulatory Visit: Payer: Self-pay | Admitting: Internal Medicine

## 2020-02-29 ENCOUNTER — Encounter: Payer: Self-pay | Admitting: Internal Medicine

## 2020-02-29 DIAGNOSIS — N182 Chronic kidney disease, stage 2 (mild): Secondary | ICD-10-CM | POA: Insufficient documentation

## 2020-02-29 DIAGNOSIS — R7302 Impaired glucose tolerance (oral): Secondary | ICD-10-CM | POA: Insufficient documentation

## 2020-02-29 DIAGNOSIS — E559 Vitamin D deficiency, unspecified: Secondary | ICD-10-CM

## 2020-02-29 LAB — HEMOGLOBIN A1C
Hgb A1c MFr Bld: 5.7 % of total Hgb — ABNORMAL HIGH (ref <5.7)
Mean Plasma Glucose: 117 (calc)
eAG (mmol/L): 6.5 (calc)

## 2020-02-29 LAB — CBC WITH DIFFERENTIAL/PLATELET
Absolute Monocytes: 422 cells/uL (ref 200–950)
Basophils Absolute: 62 cells/uL (ref 0–200)
Basophils Relative: 0.7 %
Eosinophils Absolute: 132 cells/uL (ref 15–500)
Eosinophils Relative: 1.5 %
HCT: 41.7 % (ref 35.0–45.0)
Hemoglobin: 13 g/dL (ref 11.7–15.5)
Lymphs Abs: 2508 cells/uL (ref 850–3900)
MCH: 25.6 pg — ABNORMAL LOW (ref 27.0–33.0)
MCHC: 31.2 g/dL — ABNORMAL LOW (ref 32.0–36.0)
MCV: 82.2 fL (ref 80.0–100.0)
MPV: 11.1 fL (ref 7.5–12.5)
Monocytes Relative: 4.8 %
Neutro Abs: 5676 cells/uL (ref 1500–7800)
Neutrophils Relative %: 64.5 %
Platelets: 292 10*3/uL (ref 140–400)
RBC: 5.07 10*6/uL (ref 3.80–5.10)
RDW: 14.4 % (ref 11.0–15.0)
Total Lymphocyte: 28.5 %
WBC: 8.8 10*3/uL (ref 3.8–10.8)

## 2020-02-29 LAB — COMPREHENSIVE METABOLIC PANEL
AG Ratio: 1.5 (calc) (ref 1.0–2.5)
ALT: 13 U/L (ref 6–29)
AST: 14 U/L (ref 10–35)
Albumin: 4 g/dL (ref 3.6–5.1)
Alkaline phosphatase (APISO): 66 U/L (ref 31–125)
BUN/Creatinine Ratio: 11 (calc) (ref 6–22)
BUN: 12 mg/dL (ref 7–25)
CO2: 25 mmol/L (ref 20–32)
Calcium: 9.1 mg/dL (ref 8.6–10.2)
Chloride: 108 mmol/L (ref 98–110)
Creat: 1.14 mg/dL — ABNORMAL HIGH (ref 0.50–1.10)
Globulin: 2.7 g/dL (calc) (ref 1.9–3.7)
Glucose, Bld: 99 mg/dL (ref 65–99)
Potassium: 4 mmol/L (ref 3.5–5.3)
Sodium: 139 mmol/L (ref 135–146)
Total Bilirubin: 0.4 mg/dL (ref 0.2–1.2)
Total Protein: 6.7 g/dL (ref 6.1–8.1)

## 2020-02-29 LAB — LIPID PANEL
Cholesterol: 162 mg/dL (ref <200)
HDL: 47 mg/dL — ABNORMAL LOW (ref >=50)
LDL Cholesterol (Calc): 98 mg/dL (calc)
Non-HDL Cholesterol (Calc): 115 mg/dL (calc) (ref <130)
Total CHOL/HDL Ratio: 3.4 (calc) (ref <5.0)
Triglycerides: 79 mg/dL (ref <150)

## 2020-02-29 LAB — TSH: TSH: 1.27 mIU/L

## 2020-02-29 LAB — VITAMIN B12: Vitamin B-12: 352 pg/mL (ref 200–1100)

## 2020-02-29 LAB — VITAMIN D 25 HYDROXY (VIT D DEFICIENCY, FRACTURES): Vit D, 25-Hydroxy: 23 ng/mL — ABNORMAL LOW (ref 30–100)

## 2020-02-29 MED ORDER — VITAMIN D (ERGOCALCIFEROL) 1.25 MG (50000 UNIT) PO CAPS: 50000.0000 [IU] | ORAL_CAPSULE | ORAL | 0 refills | Status: DC

## 2020-02-29 MED FILL — VIT D2 1.25 MG (50,000 UNIT: 1.25 MG | 84 days supply | Qty: 12 | Fill #0

## 2020-03-05 ENCOUNTER — Encounter: Payer: Self-pay | Admitting: Internal Medicine

## 2020-03-14 ENCOUNTER — Telehealth (INDEPENDENT_AMBULATORY_CARE_PROVIDER_SITE_OTHER): Payer: 59 | Admitting: Internal Medicine

## 2020-03-14 DIAGNOSIS — E559 Vitamin D deficiency, unspecified: Secondary | ICD-10-CM | POA: Diagnosis not present

## 2020-03-14 DIAGNOSIS — R7302 Impaired glucose tolerance (oral): Secondary | ICD-10-CM

## 2020-03-14 DIAGNOSIS — N182 Chronic kidney disease, stage 2 (mild): Secondary | ICD-10-CM

## 2020-03-14 NOTE — Progress Notes (Signed)
Virtual Visit via Video Note  I connected with Zoe Morris on 03/14/20 at  4:00 PM EDT by a video enabled telemedicine application and verified that I am speaking with the correct person using two identifiers.  Location patient: home Location provider: work office Persons participating in the virtual visit: patient, provider  I discussed the limitations of evaluation and management by telemedicine and the availability of in person appointments. The patient expressed understanding and agreed to proceed.   HPI: She has scheduled this visit to discuss her lab results.  She was seen on October 5 and labs were drawn.  She was diagnosed with vitamin D deficiency, impaired glucose tolerance with an A1c of 5.7 and stage II chronic kidney disease with a creatinine of 1.140.  She just wants to know what needs to be done and how she can prevent progression of chronic kidney disease.   ROS: Constitutional: Denies fever, chills, diaphoresis, appetite change and fatigue.  HEENT: Denies photophobia, eye pain, redness, hearing loss, ear pain, congestion, sore throat, rhinorrhea, sneezing, mouth sores, trouble swallowing, neck pain, neck stiffness and tinnitus.   Respiratory: Denies SOB, DOE, cough, chest tightness,  and wheezing.   Cardiovascular: Denies chest pain, palpitations and leg swelling.  Gastrointestinal: Denies nausea, vomiting, abdominal pain, diarrhea, constipation, blood in stool and abdominal distention.  Genitourinary: Denies dysuria, urgency, frequency, hematuria, flank pain and difficulty urinating.  Endocrine: Denies: hot or cold intolerance, sweats, changes in hair or nails, polyuria, polydipsia. Musculoskeletal: Denies myalgias, back pain, joint swelling, arthralgias and gait problem.  Skin: Denies pallor, rash and wound.  Neurological: Denies dizziness, seizures, syncope, weakness, light-headedness, numbness and headaches.  Hematological: Denies adenopathy. Easy bruising,  personal or family bleeding history  Psychiatric/Behavioral: Denies suicidal ideation, mood changes, confusion, nervousness, sleep disturbance and agitation   Past Medical History:  Diagnosis Date  . Asthma   . Complete tear of right ACL   . Current tear knee, medial meniscus   . Palpitations     Past Surgical History:  Procedure Laterality Date  . KNEE ARTHROSCOPY WITH ANTERIOR CRUCIATE LIGAMENT (ACL) REPAIR WITH HAMSTRING GRAFT Right 12/23/2017   Procedure: RIGHT KNEE ARTHROSCOPY WITH ANTERIOR CRUCIATE LIGAMENT (ACL) REPAIR WITH ALLOGRAFT HAMSTRING GRAFT;  Surgeon: Elsie Saas, MD;  Location: [facility];  Service: Orthopedics;  Laterality: Right;  . KNEE ARTHROSCOPY WITH MEDIAL MENISECTOMY Right 12/23/2017   Procedure: RIGHT KNEE ARTHROSCOPY WITH MEDIAL MENISECTOMY;  Surgeon: Elsie Saas, MD;  Location: [facility];  Service: Orthopedics;  Laterality: Right;    Family History  Problem Relation Age of Onset  . COPD Mother   . Multiple myeloma Father     SOCIAL HX:   reports that she has been smoking. She has never used smokeless tobacco. She reports current alcohol use. She reports that she does not use drugs.   Current Outpatient Medications:  Marland Kitchen  Vitamin D, Ergocalciferol, (DRISDOL) 1.25 MG (50000 UNIT) CAPS capsule, Take 1 capsule (50,000 Units total) by mouth every 7 (seven) days for 12 doses., Disp: 12 capsule, Rfl: 0  EXAM:   VITALS per patient if applicable: None reported  GENERAL: alert, oriented, appears well and in no acute distress  HEENT: atraumatic, conjunttiva clear, no obvious abnormalities on inspection of external nose and ears  NECK: normal movements of the head and neck  LUNGS: on inspection no signs of respiratory distress, breathing rate appears normal, no obvious gross increased work of breathing, gasping or wheezing  CV: no obvious  cyanosis  MS: moves all visible extremities without noticeable  abnormality  PSYCH/NEURO: pleasant and cooperative, no obvious depression or anxiety, speech and thought processing grossly intact  ASSESSMENT AND PLAN:   IGT (impaired glucose tolerance) -We have discussed lifestyle modifications and how this will help prevent progression to frank diabetes. -At this point I would like to monitor her A1c's every 6 months.  Vitamin D deficiency -She has been sent in a prescription for high-dose vitamin D which she will take weekly for 12 weeks at which point she will return for repeat vitamin D levels.  CKD (chronic kidney disease) stage 2, GFR 60-89 ml/min -This is a new diagnosis, we have discussed lifestyle modifications including weight loss, smoking cessation.  Monitor creatinine every 6 to 12 months. -She knows to use NSAIDs sparingly and to stay hydrated.     I discussed the assessment and treatment plan with the patient. The patient was provided an opportunity to ask questions and all were answered. The patient agreed with the plan and demonstrated an understanding of the instructions.   The patient was advised to call back or seek an in-person evaluation if the symptoms worsen or if the condition fails to improve as anticipated.    Lelon Frohlich, MD  [facility] at [facility]

## 2020-04-17 ENCOUNTER — Encounter: Payer: Self-pay | Admitting: Internal Medicine

## 2020-04-18 ENCOUNTER — Other Ambulatory Visit: Payer: Self-pay

## 2020-04-18 ENCOUNTER — Telehealth (INDEPENDENT_AMBULATORY_CARE_PROVIDER_SITE_OTHER): Payer: 59 | Admitting: Internal Medicine

## 2020-04-18 ENCOUNTER — Encounter: Payer: Self-pay | Admitting: Internal Medicine

## 2020-04-18 VITALS — Ht 62.0 in | Wt 194.0 lb

## 2020-04-18 DIAGNOSIS — R399 Unspecified symptoms and signs involving the genitourinary system: Secondary | ICD-10-CM | POA: Diagnosis not present

## 2020-04-18 DIAGNOSIS — R3989 Other symptoms and signs involving the genitourinary system: Secondary | ICD-10-CM

## 2020-04-18 LAB — POCT URINALYSIS DIP (CLINITEK)
Bilirubin, UA: NEGATIVE
Glucose, UA: NEGATIVE mg/dL
Ketones, POC UA: NEGATIVE mg/dL
Nitrite, UA: POSITIVE — AB
POC PROTEIN,UA: 30 — AB
Spec Grav, UA: 1.025 (ref 1.010–1.025)
Urobilinogen, UA: 0.2 E.U./dL
pH, UA: 6 (ref 5.0–8.0)

## 2020-04-18 MED ORDER — SULFAMETHOXAZOLE-TRIMETHOPRIM 800-160 MG PO TABS: 1.0000 | ORAL_TABLET | Freq: Two times a day (BID) | ORAL | 0 refills | Status: DC

## 2020-04-18 MED FILL — SULFAMETHOXAZOLE-TMP DS TAB: 800-160 | 5 days supply | Qty: 10 | Fill #0

## 2020-04-18 NOTE — Progress Notes (Signed)
Patient symptoms include frequent urination, pain with urination, pressure, decreased output. Onset of the last 2 days.

## 2020-04-18 NOTE — Progress Notes (Signed)
Virtual Visit via Video Note  I connected with@ on 04/18/20 at  2:00 PM EST by a video enabled telemedicine application and verified that I am speaking with the correct person using two identifiers. Location patient: work Environmental manager  office Persons participating in the virtual visit: patient, provider  [place] national recommendations  regarding COVID 19 pandemic   video visit is advised over in office visit for this patient.  Patient aware  of the limitations of evaluation and management by telemedicine and  availability of in person appointments. and agreed to proceed.   HPI: Zoe Morris presents for  SDA video visit PCP NA   Urinary  Frequency  fora few days  And now noted  Urgency sig dysuria today .  No fever chills flank pain .  Remote hs of uti a few years ago  No problem menses and  No abd pain  Vomiting  Diarrhea  Vag sx  or recent  Antibiotic use   ROS: See pertinent positives and negatives per HPI.  Past Medical History:  Diagnosis Date  . Asthma   . Complete tear of right ACL   . Current tear knee, medial meniscus   . Palpitations     Past Surgical History:  Procedure Laterality Date  . KNEE ARTHROSCOPY WITH ANTERIOR CRUCIATE LIGAMENT (ACL) REPAIR WITH HAMSTRING GRAFT Right 12/23/2017   Procedure: RIGHT KNEE ARTHROSCOPY WITH ANTERIOR CRUCIATE LIGAMENT (ACL) REPAIR WITH ALLOGRAFT HAMSTRING GRAFT;  Surgeon: Elsie Saas, MD;  Location: [place];  Service: Orthopedics;  Laterality: Right;  . KNEE ARTHROSCOPY WITH MEDIAL MENISECTOMY Right 12/23/2017   Procedure: RIGHT KNEE ARTHROSCOPY WITH MEDIAL MENISECTOMY;  Surgeon: Elsie Saas, MD;  Location: [place];  Service: Orthopedics;  Laterality: Right;    Family History  Problem Relation Age of Onset  . COPD Mother   . Multiple myeloma Father     Social History   Tobacco Use  . Smoking status: Current Every Day Smoker  . Smokeless tobacco: Never Used   Substance Use Topics  . Alcohol use: Yes    Comment: occasional  . Drug use: No      Current Outpatient Medications:  Marland Kitchen  Vitamin D, Ergocalciferol, (DRISDOL) 1.25 MG (50000 UNIT) CAPS capsule, Take 1 capsule (50,000 Units total) by mouth every 7 (seven) days for 12 doses., Disp: 12 capsule, Rfl: 0 .  sulfamethoxazole-trimethoprim (BACTRIM DS) 800-160 MG tablet, Take 1 tablet by mouth 2 (two) times daily., Disp: 10 tablet, Rfl: 0  EXAM: BP Readings from Last 3 Encounters:  02/28/20 120/90  04/12/18 125/88  12/23/17 (!) 145/85    VITALS per patient if applicable:  GENERAL: alert, oriented, appears well and in no acute distress  HEENT: atraumatic, conjunttiva clear, no obvious abnormalities on inspection of external nose and ears NECK: normal movements of the head and neck LUNGS: on inspection no signs of respiratory distress, breathing rate appears normal, no obvious gross SOB, gasping or wheezing CV: no obvious cyanosis  MS: moves all visible extremities without noticeable abnormality  PSYCH/NEURO: pleasant and cooperative, no obvious depression or anxiety, speech and thought processing grossly intact Lab Results  Component Value Date   WBC 8.8 02/28/2020   HGB 13.0 02/28/2020   HCT 41.7 02/28/2020   PLT 292 02/28/2020   GLUCOSE 99 02/28/2020   CHOL 162 02/28/2020   TRIG 79 02/28/2020   HDL 47 (L) 02/28/2020   LDLCALC 98 02/28/2020   ALT 13 02/28/2020   AST 14 02/28/2020  NA 139 02/28/2020   K 4.0 02/28/2020   CL 108 02/28/2020   CREATININE 1.14 (H) 02/28/2020   BUN 12 02/28/2020   CO2 25 02/28/2020   TSH 1.27 02/28/2020   HGBA1C 5.7 (H) 02/28/2020   Urinalysis    Component Value Date/Time   COLORURINE YELLOW 08/01/2008 1439   APPEARANCEUR HAZY (A) 08/01/2008 1439   LABSPEC 1.020 08/01/2008 1439   PHURINE 7.0 08/01/2008 1439   GLUCOSEU NEGATIVE 08/01/2008 1439   HGBUR NEGATIVE 08/01/2008 1439   BILIRUBINUR negative 04/18/2020 1412   BILIRUBINUR  negative 04/12/2018 1838   KETONESUR negative 04/18/2020 1412   KETONESUR NEGATIVE 08/01/2008 1439   PROTEINUR Negative 04/12/2018 1838   PROTEINUR NEGATIVE 08/01/2008 1439   UROBILINOGEN 0.2 04/18/2020 1412   UROBILINOGEN 0.2 08/01/2008 1439   NITRITE Positive (A) 04/18/2020 1412   NITRITE negative 04/12/2018 1838   NITRITE NEGATIVE 08/01/2008 1439   LEUKOCYTESUR Large (3+) (A) 04/18/2020 1412     ASSESSMENT AND PLAN:  Discussed the following assessment and plan:    ICD-10-CM   1. UTI symptoms  R39.9 POCT URINALYSIS DIP (CLINITEK)  2. Suspected UTI  R39.89    Counseled.  andtiboitic pending culture and  Plan  Can use otc  Azo if needed in interim   Expectant management and discussion of plan and treatment with opportunity to ask questions and all were answered. The patient agreed with the plan and demonstrated an understanding of the instructions.   Advised to call back or seek an in-person evaluation if worsening  or having  further concerns . Return if symptoms worsen or fail to improve as expected.    Shanon Ace, MD

## 2020-04-20 LAB — URINE CULTURE
MICRO NUMBER:: 11244473
SPECIMEN QUALITY:: ADEQUATE

## 2020-04-23 NOTE — Progress Notes (Signed)
urine culture shows e coli  sensitive to medication given . Should resolve with current treatment .FU if not better. 

## 2020-06-20 ENCOUNTER — Encounter: Payer: Self-pay | Admitting: Internal Medicine

## 2020-06-20 ENCOUNTER — Other Ambulatory Visit: Payer: Self-pay

## 2020-06-20 ENCOUNTER — Ambulatory Visit: Payer: 59 | Admitting: Internal Medicine

## 2020-06-20 VITALS — BP 120/84 | HR 76 | Ht 62.0 in | Wt 195.0 lb

## 2020-06-20 DIAGNOSIS — R002 Palpitations: Secondary | ICD-10-CM

## 2020-06-20 NOTE — Patient Instructions (Signed)
Medication Instructions:  No changes *If you need a refill on your cardiac medications before your next appointment, please call your pharmacy*   Lab Work: none  Testing/Procedures: [facility] Monitor Instructions   Your physician has requested you wear your ZIO patch monitor__3_____days.   This is a single patch monitor.  [employer] supplies one patch monitor per enrollment.  Additional stickers are not available.   Please do not apply patch if you will be having a Nuclear Stress Test, Echocardiogram, Cardiac CT, MRI, or Chest Xray during the time frame you would be wearing the monitor. The patch cannot be worn during these tests.  You cannot remove and re-apply the ZIO XT patch monitor.   Your ZIO patch monitor will be sent USPS Priority mail from [facility] directly to your home address. The monitor may also be mailed to a PO BOX if home delivery is not available.   It may take 3-5 days to receive your monitor after you have been enrolled.   Once you have received you monitor, please review enclosed instructions.  Your monitor has already been registered assigning a specific monitor serial # to you.   Applying the monitor   Shave hair from upper left chest.   Hold abrader disc by orange tab.  Rub abrader in 40 strokes over left upper chest as indicated in your monitor instructions.   Clean area with 4 enclosed alcohol pads .  Use all pads to assure are is cleaned thoroughly.  Let dry.   Apply patch as indicated in monitor instructions.  Patch will be place under collarbone on left side of chest with arrow pointing upward.   Rub patch adhesive wings for 2 minutes.Remove white label marked "1".  Remove white label marked "2".  Rub patch adhesive wings for 2 additional minutes.   While looking in a mirror, press and release button in center of patch.  A small green light will flash 3-4 times .  This will be your only indicator the monitor has been turned on.     Do  not shower for the first 24 hours.  You may shower after the first 24 hours.   Press button if you feel a symptom. You will hear a small click.  Record Date, Time and Symptom in the Patient Log Book.   When you are ready to remove patch, follow instructions on last 2 pages of Patient Log Book.  Stick patch monitor onto last page of Patient Log Book.   Place Patient Log Book in [place] box.  Use locking tab on box and tape box closed securely.  The [employer] and [employer] has [employer] on it.  Please place in mailbox as soon as possible.  Your physician should have your test results approximately 7 days after the monitor has been mailed back to [facility].   Call [facility] at [phone] if you have questions regarding your ZIO XT patch monitor.  Call them immediately if you see an orange light blinking on your monitor.   If your monitor falls off in less than 4 days contact our Monitor department at [phone].  If your monitor becomes loose or falls off after 4 days call [employer] at [phone] for suggestions on securing your monitor.    Follow-Up: Follow up with your physician will depend on test results.   Other Instructions

## 2020-06-20 NOTE — Progress Notes (Signed)
Cardiology Office Note   Date:  06/20/2020   ID:  Zoe Morris, DOB [birth year], MRN [id number]  PCP:  Isaac Bliss, Rayford Halsted, MD  Cardiologist:   Dorris Carnes, MD   PT presents for eval of palpitations    History of Present Illness: Zoe Morris is a 45 y.o. female with a history of Palpitations   She has had for a long time   Got better,Was only having occasionally Then, in the past few months, they have gotten worse  Having more frequently  Couple times per day      Worse during the week.  Not as much on the weekend   Spells short lived  No dizziness   She deneis caffeine intake  Says sleep deprivation and dehydration make it worse  When not having spells feels fine   No CP   Breathg is OK       No outpatient medications have been marked as taking for the 06/20/20 encounter (Office Visit) with Fay Records, MD.     Allergies:   Patient has no known allergies.   Past Medical History:  Diagnosis Date  . Asthma   . Complete tear of right ACL   . Current tear knee, medial meniscus   . Palpitations     Past Surgical History:  Procedure Laterality Date  . KNEE ARTHROSCOPY WITH ANTERIOR CRUCIATE LIGAMENT (ACL) REPAIR WITH HAMSTRING GRAFT Right 12/23/2017   Procedure: RIGHT KNEE ARTHROSCOPY WITH ANTERIOR CRUCIATE LIGAMENT (ACL) REPAIR WITH ALLOGRAFT HAMSTRING GRAFT;  Surgeon: Elsie Saas, MD;  Location: [place];  Service: Orthopedics;  Laterality: Right;  . KNEE ARTHROSCOPY WITH MEDIAL MENISECTOMY Right 12/23/2017   Procedure: RIGHT KNEE ARTHROSCOPY WITH MEDIAL MENISECTOMY;  Surgeon: Elsie Saas, MD;  Location: [place];  Service: Orthopedics;  Laterality: Right;     Social History:  The patient  reports that she has been smoking. She has never used smokeless tobacco. She reports current alcohol use. She reports that she does not use drugs.   Family History:  The patient's family history includes COPD in her mother;  Multiple myeloma in her father.    ROS:  Please see the history of present illness. All other systems are reviewed and  Negative to the above problem except as noted.    PHYSICAL EXAM: VS:  BP 120/84   Pulse 76   Ht $R'5\' 2"'vR$  (1.575 m)   Wt 195 lb (88.5 kg)   SpO2 99%   BMI 35.67 kg/m   GEN: Pt is 45 yo  in no acute distress  HEENT: normal  Neck: no JVD, carotid bruits Cardiac: RRR; no murmurs.  NO LE  edema  Respiratory:  clear to auscultation bilaterally, GI: soft, nontender, nondistended, + BS  No hepatomegaly  MS: no deformity Moving all extremities   Skin: warm and dry, no rash Neuro:  Strength and sensation are intact Psych: euthymic mood, full affect   EKG:  EKG is ordered today.  SR 76 bpm   Nonspecfic ST changes   Lipid Panel    Component Value Date/Time   CHOL 162 02/28/2020 0917   CHOL 140 05/02/2019 1231   TRIG 79 02/28/2020 0917   HDL 47 (L) 02/28/2020 0917   HDL 48 05/02/2019 1231   CHOLHDL 3.4 02/28/2020 0917   LDLCALC 98 02/28/2020 0917      Wt Readings from Last 3 Encounters:  06/20/20 195 lb (88.5 kg)  04/18/20 194 lb (88 kg)  02/28/20 194 lb 14.4 oz (88.4 kg)      ASSESSMENT AND PLAN:  1  Palpitations   Pt with hx of palpitatoins   REcently occurring more frequently   Short lived  DO not sound hemodynamically destabilizing   Will set up for monitor to try to capture    Otherwise exam normal   No further testing for now.  Stay hydrated    Stay active    Current medicines are reviewed at length with the patient today.  The patient does not have concerns regarding medicines.  Signed, Dorris Carnes, MD  06/20/2020 4:52 PM    [address], [place], [place]  [address] Phone: [phone]; Fax: [phone]

## 2020-06-25 ENCOUNTER — Ambulatory Visit (INDEPENDENT_AMBULATORY_CARE_PROVIDER_SITE_OTHER): Payer: 59

## 2020-06-25 DIAGNOSIS — R002 Palpitations: Secondary | ICD-10-CM | POA: Diagnosis not present

## 2020-07-06 ENCOUNTER — Ambulatory Visit: Payer: 59 | Admitting: Internal Medicine

## 2020-07-17 DIAGNOSIS — R002 Palpitations: Secondary | ICD-10-CM | POA: Diagnosis not present

## 2020-07-27 ENCOUNTER — Other Ambulatory Visit: Payer: Self-pay | Admitting: Internal Medicine

## 2020-07-27 ENCOUNTER — Telehealth: Payer: Self-pay | Admitting: *Deleted

## 2020-07-27 MED ORDER — METOPROLOL SUCCINATE ER 25 MG PO TB24: 12.5000 mg | ORAL_TABLET | Freq: Every day | ORAL | 3 refills | Status: DC

## 2020-07-27 MED FILL — METOPROLOL SUCCINATE ER 25: 25 | 90 days supply | Qty: 45 | Fill #0

## 2020-07-27 NOTE — Telephone Encounter (Signed)
-----   Message from Fay Records, MD sent at 07/23/2020  7:54 AM EST ----- Sinus rhythm with rare PACs, PVCs.    Patient can try low dose Toprol XL 12.5 to see if helps with symptoms Stay hydrated

## 2020-07-27 NOTE — Telephone Encounter (Signed)
Reviewed with the patient and sent Toprol XL to pharmacy.  Adv to stay hydrated.

## 2020-09-04 ENCOUNTER — Other Ambulatory Visit: Payer: Self-pay | Admitting: Internal Medicine

## 2020-09-04 ENCOUNTER — Other Ambulatory Visit (HOSPITAL_COMMUNITY): Payer: Self-pay

## 2020-09-04 ENCOUNTER — Ambulatory Visit: Payer: 59 | Admitting: Internal Medicine

## 2020-09-04 ENCOUNTER — Other Ambulatory Visit: Payer: Self-pay

## 2020-09-04 ENCOUNTER — Encounter: Payer: Self-pay | Admitting: Internal Medicine

## 2020-09-04 VITALS — BP 110/80 | HR 81 | Temp 98.5°F | Wt 196.3 lb

## 2020-09-04 DIAGNOSIS — E669 Obesity, unspecified: Secondary | ICD-10-CM | POA: Diagnosis not present

## 2020-09-04 DIAGNOSIS — E559 Vitamin D deficiency, unspecified: Secondary | ICD-10-CM

## 2020-09-04 DIAGNOSIS — Z1211 Encounter for screening for malignant neoplasm of colon: Secondary | ICD-10-CM

## 2020-09-04 DIAGNOSIS — N182 Chronic kidney disease, stage 2 (mild): Secondary | ICD-10-CM | POA: Diagnosis not present

## 2020-09-04 DIAGNOSIS — R7302 Impaired glucose tolerance (oral): Secondary | ICD-10-CM

## 2020-09-04 LAB — POCT GLYCOSYLATED HEMOGLOBIN (HGB A1C): Hemoglobin A1C: 6 % — AB (ref 4.0–5.6)

## 2020-09-04 LAB — VITAMIN D 25 HYDROXY (VIT D DEFICIENCY, FRACTURES): VITD: 24.44 ng/mL — ABNORMAL LOW (ref 30.00–100.00)

## 2020-09-04 LAB — COMPREHENSIVE METABOLIC PANEL
ALT: 13 U/L (ref 0–35)
AST: 14 U/L (ref 0–37)
Albumin: 3.8 g/dL (ref 3.5–5.2)
Alkaline Phosphatase: 77 U/L (ref 39–117)
BUN: 13 mg/dL (ref 6–23)
CO2: 25 mEq/L (ref 19–32)
Calcium: 8.9 mg/dL (ref 8.4–10.5)
Chloride: 107 mEq/L (ref 96–112)
Creatinine, Ser: 1.18 mg/dL (ref 0.40–1.20)
GFR: 55.45 mL/min — ABNORMAL LOW (ref >60.00)
Glucose, Bld: 91 mg/dL (ref 70–99)
Potassium: 4.2 mEq/L (ref 3.5–5.1)
Sodium: 141 mEq/L (ref 135–145)
Total Bilirubin: 0.3 mg/dL (ref 0.2–1.2)
Total Protein: 6.8 g/dL (ref 6.0–8.3)

## 2020-09-04 MED ORDER — VITAMIN D (ERGOCALCIFEROL) 1.25 MG (50000 UNIT) PO CAPS
50000.0000 [IU] | ORAL_CAPSULE | ORAL | 0 refills | Status: AC
  Filled 2020-09-04: qty 12, 84d supply, fill #0

## 2020-09-04 NOTE — Progress Notes (Signed)
Established Patient Office Visit     This visit occurred during the SARS-CoV-2 public health emergency.  Safety protocols were in place, including screening questions prior to the visit, additional usage of staff PPE, and extensive cleaning of exam room while observing appropriate contact time as indicated for disinfecting solutions.    CC/Reason for Visit: 12-month follow-up chronic medical conditions  HPI: Zoe Morris is a 45 y.o. female who is coming in today for the above mentioned reasons. Past Medical History is significant for: Impaired glucose tolerance, vitamin D deficiency, chronic kidney disease stage II, obesity and ongoing nicotine dependence.  She has been doing well since we last spoke.  She will be getting married in August.  She has her mammogram through her GYN office (last in September 2021).  She is willing for GI referral for initial screening colonoscopy.  She is due to have renal function and vitamin D levels checked.  We have discussed weight loss and lifestyle changes at length today.  We have discussed Covid vaccinations which she continues to decline at this point.   Past Medical/Surgical History: Past Medical History:  Diagnosis Date  . Asthma   . Complete tear of right ACL   . Current tear knee, medial meniscus   . Palpitations     Past Surgical History:  Procedure Laterality Date  . KNEE ARTHROSCOPY WITH ANTERIOR CRUCIATE LIGAMENT (ACL) REPAIR WITH HAMSTRING GRAFT Right 12/23/2017   Procedure: RIGHT KNEE ARTHROSCOPY WITH ANTERIOR CRUCIATE LIGAMENT (ACL) REPAIR WITH ALLOGRAFT HAMSTRING GRAFT;  Surgeon: Elsie Saas, MD;  Location: [facility];  Service: Orthopedics;  Laterality: Right;  . KNEE ARTHROSCOPY WITH MEDIAL MENISECTOMY Right 12/23/2017   Procedure: RIGHT KNEE ARTHROSCOPY WITH MEDIAL MENISECTOMY;  Surgeon: Elsie Saas, MD;  Location: [facility];  Service: Orthopedics;  Laterality: Right;    Social  History:  reports that she has been smoking. She has never used smokeless tobacco. She reports current alcohol use. She reports that she does not use drugs.  Allergies: No Known Allergies  Family History:  Family History  Problem Relation Age of Onset  . COPD Mother   . Multiple myeloma Father      Current Outpatient Medications:  .  metoprolol succinate (TOPROL-XL) 25 MG 24 hr tablet, TAKE 0.5 TABLETS (12.5 MG TOTAL) BY MOUTH DAILY. (Patient not taking: Reported on 09/04/2020), Disp: 90 tablet, Rfl: 3  Review of Systems:  Constitutional: Denies fever, chills, diaphoresis, appetite change and fatigue.  HEENT: Denies photophobia, eye pain, redness, hearing loss, ear pain, congestion, sore throat, rhinorrhea, sneezing, mouth sores, trouble swallowing, neck pain, neck stiffness and tinnitus.   Respiratory: Denies SOB, DOE, cough, chest tightness,  and wheezing.   Cardiovascular: Denies chest pain, palpitations and leg swelling.  Gastrointestinal: Denies nausea, vomiting, abdominal pain, diarrhea, constipation, blood in stool and abdominal distention.  Genitourinary: Denies dysuria, urgency, frequency, hematuria, flank pain and difficulty urinating.  Endocrine: Denies: hot or cold intolerance, sweats, changes in hair or nails, polyuria, polydipsia. Musculoskeletal: Denies myalgias, back pain, joint swelling, arthralgias and gait problem.  Skin: Denies pallor, rash and wound.  Neurological: Denies dizziness, seizures, syncope, weakness, light-headedness, numbness and headaches.  Hematological: Denies adenopathy. Easy bruising, personal or family bleeding history  Psychiatric/Behavioral: Denies suicidal ideation, mood changes, confusion, nervousness, sleep disturbance and agitation    Physical Exam: Vitals:   09/04/20 0904  BP: 110/80  Pulse: 81  Temp: 98.5 F (36.9 C)  TempSrc: Oral  SpO2: 99%  Weight: 196 lb 4.8 oz (89 kg)    Body mass index is 35.9 kg/m.   Constitutional:  NAD, calm, comfortable Eyes: PERRL, lids and conjunctivae normal ENMT: Mucous membranes are moist.  Respiratory: clear to auscultation bilaterally, no wheezing, no crackles. Normal respiratory effort. No accessory muscle use.  Cardiovascular: Regular rate and rhythm, no murmurs / rubs / gallops. No extremity edema.  Neurologic: Grossly intact and nonfocal.  Psychiatric: Normal judgment and insight. Alert and oriented x 3. Normal mood.    Impression and Plan:  IGT (impaired glucose tolerance)  -A1c in office today has increased to 6.0. -We have discussed lifestyle changes extensively, continue to monitor in 6 months.  Screening for malignant neoplasm of colon  - Plan: Ambulatory referral to Gastroenterology  Vitamin D deficiency  - Plan: VITAMIN D 25 Hydroxy (Vit-D Deficiency, Fractures)  CKD (chronic kidney disease) stage 2, GFR 60-89 ml/min  - Plan: Comprehensive metabolic panel -Last creatinine was 1.140 in October 2021.  Obesity (BMI 35.0-39.9 without comorbidity) -Discussed healthy lifestyle, including increased physical activity and better food choices to promote weight loss.    Lelon Frohlich, MD [facility] at [facility]

## 2020-09-04 NOTE — Addendum Note (Signed)
Addended by: Elmer Picker on: 09/04/2020 09:35 AM   Modules accepted: Orders

## 2020-09-06 ENCOUNTER — Other Ambulatory Visit (HOSPITAL_COMMUNITY): Payer: Self-pay

## 2020-12-14 ENCOUNTER — Encounter: Payer: Self-pay | Admitting: Gastroenterology

## 2021-01-10 ENCOUNTER — Other Ambulatory Visit: Payer: Self-pay

## 2021-01-10 ENCOUNTER — Other Ambulatory Visit (HOSPITAL_COMMUNITY): Payer: Self-pay

## 2021-01-10 ENCOUNTER — Ambulatory Visit (AMBULATORY_SURGERY_CENTER): Payer: 59

## 2021-01-10 VITALS — Ht 62.0 in | Wt 190.0 lb

## 2021-01-10 DIAGNOSIS — Z1211 Encounter for screening for malignant neoplasm of colon: Secondary | ICD-10-CM

## 2021-01-10 MED ORDER — NA SULFATE-K SULFATE-MG SULF 17.5-3.13-1.6 GM/177ML PO SOLN
1.0000 | Freq: Once | ORAL | 0 refills | Status: AC
  Filled 2021-01-10 – 2021-01-18 (×2): qty 354, 1d supply, fill #0

## 2021-01-10 NOTE — Progress Notes (Signed)
Pre visit completed via phone call; Patient verified name, DOB, and address; No egg or soy allergy known to patient  No issues with past sedation with any surgeries or procedures Patient denies ever being told they had issues or difficulty with intubation  No FH of Malignant Hyperthermia No diet pills per patient No home 02 use per patient  No blood thinners per patient  Pt denies issues with constipation  No A fib or A flutter  EMMI video via MyChart  COVID 19 guidelines implemented in PV today with Pt and RN  NO PA's for preps discussed with pt in PV today  Discussed with pt there will be an out-of-pocket cost for prep and that varies from $0 to 70 dollars  Due to the COVID-19 pandemic we are asking patients to follow certain guidelines.  Pt aware of COVID protocols and [facility] guidelines   

## 2021-01-18 ENCOUNTER — Other Ambulatory Visit (HOSPITAL_COMMUNITY): Payer: Self-pay

## 2021-01-18 ENCOUNTER — Encounter: Payer: Self-pay | Admitting: Gastroenterology

## 2021-01-21 ENCOUNTER — Telehealth: Payer: Self-pay | Admitting: Gastroenterology

## 2021-01-21 NOTE — Telephone Encounter (Signed)
Hi Dr. Tarri Glenn, this patient just called to cancel procedure that was scheduled on 01/24/21 because she has issues with her ride. Patient has rescheduled to 01/25/21. Thank you.

## 2021-01-22 NOTE — Telephone Encounter (Signed)
Noted. Thanks.

## 2021-01-24 ENCOUNTER — Encounter: Payer: 59 | Admitting: Gastroenterology

## 2021-01-25 ENCOUNTER — Other Ambulatory Visit: Payer: Self-pay

## 2021-01-25 ENCOUNTER — Encounter: Payer: Self-pay | Admitting: Gastroenterology

## 2021-01-25 ENCOUNTER — Ambulatory Visit (AMBULATORY_SURGERY_CENTER): Payer: 59 | Admitting: Gastroenterology

## 2021-01-25 VITALS — BP 120/84 | HR 66 | Temp 98.1°F | Resp 20 | Ht 62.0 in | Wt 190.0 lb

## 2021-01-25 DIAGNOSIS — J45909 Unspecified asthma, uncomplicated: Secondary | ICD-10-CM | POA: Diagnosis not present

## 2021-01-25 DIAGNOSIS — K635 Polyp of colon: Secondary | ICD-10-CM | POA: Diagnosis not present

## 2021-01-25 DIAGNOSIS — Z1211 Encounter for screening for malignant neoplasm of colon: Secondary | ICD-10-CM | POA: Diagnosis not present

## 2021-01-25 DIAGNOSIS — N182 Chronic kidney disease, stage 2 (mild): Secondary | ICD-10-CM | POA: Diagnosis not present

## 2021-01-25 DIAGNOSIS — D125 Benign neoplasm of sigmoid colon: Secondary | ICD-10-CM

## 2021-01-25 DIAGNOSIS — F329 Major depressive disorder, single episode, unspecified: Secondary | ICD-10-CM | POA: Diagnosis not present

## 2021-01-25 DIAGNOSIS — F419 Anxiety disorder, unspecified: Secondary | ICD-10-CM | POA: Diagnosis not present

## 2021-01-25 MED ORDER — SODIUM CHLORIDE 0.9 % IV SOLN: 500.0000 mL | Freq: Once | INTRAVENOUS | Status: DC

## 2021-01-25 NOTE — Progress Notes (Signed)
   Referring Provider: Isaac Bliss, [facility]* Primary Care Physician:  Isaac Bliss, Rayford Halsted, MD  Reason for Procedure:  Colon cancer screening   IMPRESSION:  Need for colon cancer screening  PLAN: Colonoscopy in the [place] today   HPI: Zoe Morris is a 45 y.o. female presents for screening colonoscopy.  No prior colonoscopy or colon cancer screening.  No baseline GI symptoms.   No known family history of colon cancer or polyps. No family history of uterine/endometrial cancer, pancreatic cancer or gastric/stomach cancer.   Past Medical History:  Diagnosis Date   Anxiety    hx of   Asthma    Chronic kidney disease    stage 2   Complete tear of right ACL    Current tear knee, medial meniscus    Depression    hx of   Palpitations    Vitamin D deficiency     Past Surgical History:  Procedure Laterality Date   KNEE ARTHROSCOPY WITH ANTERIOR CRUCIATE LIGAMENT (ACL) REPAIR WITH HAMSTRING GRAFT Right 12/23/2017   Procedure: RIGHT KNEE ARTHROSCOPY WITH ANTERIOR CRUCIATE LIGAMENT (ACL) REPAIR WITH ALLOGRAFT HAMSTRING GRAFT;  Surgeon: Elsie Saas, MD;  Location: [facility];  Service: Orthopedics;  Laterality: Right;   KNEE ARTHROSCOPY WITH MEDIAL MENISECTOMY Right 12/23/2017   Procedure: RIGHT KNEE ARTHROSCOPY WITH MEDIAL MENISECTOMY;  Surgeon: Elsie Saas, MD;  Location: [facility];  Service: Orthopedics;  Laterality: Right;   TONSILLECTOMY AND ADENOIDECTOMY      No current outpatient medications on file.   Current Facility-Administered Medications  Medication Dose Route Frequency Provider Last Rate Last Admin   0.9 %  sodium chloride infusion  500 mL Intravenous Once Thornton Park, MD        Allergies as of 01/25/2021   (No Known Allergies)    Family History  Problem Relation Age of Onset   COPD Mother    Multiple myeloma Father    Prostate cancer Paternal Grandfather    Colon polyps Neg Hx    Colon cancer Neg  Hx    Esophageal cancer Neg Hx    Stomach cancer Neg Hx    Rectal cancer Neg Hx      Physical Exam: General:   Alert,  well-nourished, pleasant and cooperative in NAD Head:  Normocephalic and atraumatic. Eyes:  Sclera clear, no icterus.   Conjunctiva pink. Mouth:  No deformity or lesions.   Neck:  Supple; no masses or thyromegaly. Lungs:  Clear throughout to auscultation.   No wheezes. Heart:  Regular rate and rhythm; no murmurs. Abdomen:  Soft, non-tender, nondistended, normal bowel sounds, no rebound or guarding.  Msk:  Symmetrical. No boney deformities LAD: No inguinal or umbilical LAD Extremities:  No clubbing or edema. Neurologic:  Alert and  oriented x4;  grossly nonfocal Skin:  No obvious rash or bruise. Psych:  Alert and cooperative. Normal mood and affect.     Studies/Results: No results found.    Haidy Kackley L. Tarri Glenn, MD, MPH 01/25/2021, 11:38 AM

## 2021-01-25 NOTE — Progress Notes (Signed)
A and O x3. Report to RN. Tolerated MAC anesthesia well. 

## 2021-01-25 NOTE — Progress Notes (Signed)
Pt's states no medical or surgical changes since previsit or office visit. 

## 2021-01-25 NOTE — Op Note (Signed)
[place] Patient Name: Zoe Morris Procedure Date: 01/25/2021 11:35 AM MRN: [id number] Endoscopist: Thornton Park MD, MD Age: 45 Referring MD:  Date of Birth: [birth year] Gender: Female Account #: [id number] Procedure:                Colonoscopy Indications:              Screening for colorectal malignant neoplasm, This                            is the patient's first colonoscopy Medicines:                Monitored Anesthesia Care Procedure:                Pre-Anesthesia Assessment:                           - Prior to the procedure, a History and Physical                            was performed, and patient medications and                            allergies were reviewed. The patient's tolerance of                            previous anesthesia was also reviewed. The risks                            and benefits of the procedure and the sedation                            options and risks were discussed with the patient.                            All questions were answered, and informed consent                            was obtained. Prior Anticoagulants: The patient has                            taken no previous anticoagulant or antiplatelet                            agents. ASA Grade Assessment: II - A patient with                            mild systemic disease. After reviewing the risks                            and benefits, the patient was deemed in                            satisfactory condition to undergo the procedure.  After obtaining informed consent, the colonoscope                            was passed under direct vision. Throughout the                            procedure, the patient's blood pressure, pulse, and                            oxygen saturations were monitored continuously. The                            CF [id number] was introduced through the anus                            and advanced to  the 3 cm into the ileum. A second                            forward view of the right colon was performed. The                            colonoscopy was performed without difficulty. The                            patient tolerated the procedure well. The quality                            of the bowel preparation was good. The terminal                            ileum, ileocecal valve, appendiceal orifice, and                            rectum were photographed. Scope In: 11:48:06 AM Scope Out: 11:59:56 AM Scope Withdrawal Time: 0 hours 8 minutes 45 seconds  Total Procedure Duration: 0 hours 11 minutes 50 seconds  Findings:                 The perianal and digital rectal examinations were                            normal.                           A 2 mm polyp was found in the sigmoid colon. The                            polyp was sessile. The polyp was removed with a                            cold snare. Resection and retrieval were complete.                            Estimated blood loss was  minimal.                           The exam was otherwise without abnormality on                            direct and retroflexion views. Complications:            No immediate complications. Estimated blood loss:                            Minimal. Estimated Blood Loss:     Estimated blood loss was minimal. Impression:               - One 2 mm polyp in the sigmoid colon, removed with                            a cold snare. Resected and retrieved.                           - The examination was otherwise normal on direct                            and retroflexion views. Recommendation:           - Patient has a contact number available for                            emergencies. The signs and symptoms of potential                            delayed complications were discussed with the                            patient. Return to normal activities tomorrow.                             Written discharge instructions were provided to the                            patient.                           - Resume previous diet.                           - Continue present medications.                           - Await pathology results.                           - Repeat colonoscopy date to be determined after                            pending pathology results are reviewed for  surveillance.                           - Emerging evidence supports eating a diet of                            fruits, vegetables, grains, calcium, and yogurt                            while reducing red meat and alcohol may reduce the                            risk of colon cancer.                           - Thank you for allowing me to be involved in your                            colon cancer prevention. Thornton Park MD, MD 01/25/2021 12:07:02 PM This report has been signed electronically.

## 2021-01-25 NOTE — Patient Instructions (Addendum)
Handout on polyps given. Await pathology results to determine when next screening colonoscopy needs to be performed.     YOU HAD AN ENDOSCOPIC PROCEDURE TODAY AT [facility]:   Refer to the procedure report that was given to you for any specific questions about what was found during the examination.  If the procedure report does not answer your questions, please call your gastroenterologist to clarify.  If you requested that your care partner not be given the details of your procedure findings, then the procedure report has been included in a sealed envelope for you to review at your convenience later.  YOU SHOULD EXPECT: Some feelings of bloating in the abdomen. Passage of more gas than usual.  Walking can help get rid of the air that was put into your GI tract during the procedure and reduce the bloating. If you had a lower endoscopy (such as a colonoscopy or flexible sigmoidoscopy) you may notice spotting of blood in your stool or on the toilet paper. If you underwent a bowel prep for your procedure, you may not have a normal bowel movement for a few days.  Please Note:  You might notice some irritation and congestion in your nose or some drainage.  This is from the oxygen used during your procedure.  There is no need for concern and it should clear up in a day or so.  SYMPTOMS TO REPORT IMMEDIATELY:  Following lower endoscopy (colonoscopy or flexible sigmoidoscopy):  Excessive amounts of blood in the stool  Significant tenderness or worsening of abdominal pains  Swelling of the abdomen that is new, acute  Fever of 100F or higher  For urgent or emergent issues, a gastroenterologist can be reached at any hour by calling [phone]. Do not use MyChart messaging for urgent concerns.    DIET:  We do recommend a small meal at first, but then you may proceed to your regular diet.  Drink plenty of fluids but you should avoid alcoholic beverages for 24 hours.  ACTIVITY:  You  should plan to take it easy for the rest of today and you should NOT DRIVE or use heavy machinery until tomorrow (because of the sedation medicines used during the test).    FOLLOW UP: Our staff will call the number listed on your records 48-72 hours following your procedure to check on you and address any questions or concerns that you may have regarding the information given to you following your procedure. If we do not reach you, we will leave a message.  We will attempt to reach you two times.  During this call, we will ask if you have developed any symptoms of COVID 19. If you develop any symptoms (ie: fever, flu-like symptoms, shortness of breath, cough etc.) before then, please call [phone].  If you test positive for Covid 19 in the 2 weeks post procedure, please call and report this information to [place].    If any biopsies were taken you will be contacted by phone or by letter within the next 1-3 weeks.  Please call us at [phone] if you have not heard about the biopsies in 3 weeks.    SIGNATURES/CONFIDENTIALITY: You and/or your care partner have signed paperwork which will be entered into your electronic medical record.  These signatures attest to the fact that that the information above on your After Visit Summary has been reviewed and is understood.  Full responsibility of the confidentiality of this discharge information lies with you and/or your  care-partner.  

## 2021-01-25 NOTE — Progress Notes (Signed)
Called to room to assist during endoscopic procedure.  Patient ID and intended procedure confirmed with present staff. Received instructions for my participation in the procedure from the performing physician.  

## 2021-01-29 ENCOUNTER — Telehealth: Payer: Self-pay | Admitting: *Deleted

## 2021-01-29 NOTE — Telephone Encounter (Signed)
Have you developed a fever since your procedure? no  2.   Have you had an respiratory symptoms (SOB or cough) since your procedure? no  3.   Have you tested positive for COVID 19 since your procedure no  4.   Have you had any family members/close contacts diagnosed with the COVID 19 since your procedure?  no   If yes to any of these questions please route to Joylene John, RN and Joella Prince, RN  Follow up Call-  Call back number 01/25/2021  Post procedure Call Back phone  # [phone]  Permission to leave phone message Yes  Some recent data might be hidden     Patient questions:  Do you have a fever, pain , or abdominal swelling? No. Pain Score  0 *  Have you tolerated food without any problems? Yes.    Have you been able to return to your normal activities? Yes.    Do you have any questions about your discharge instructions: Diet   No. Medications  No. Follow up visit  No.  Do you have questions or concerns about your Care? No.  Actions: * If pain score is 4 or above: No action needed, pain <4.

## 2021-02-03 ENCOUNTER — Encounter: Payer: Self-pay | Admitting: Gastroenterology

## 2021-03-01 ENCOUNTER — Encounter: Payer: Self-pay | Admitting: Internal Medicine

## 2021-03-01 ENCOUNTER — Other Ambulatory Visit: Payer: Self-pay

## 2021-03-01 ENCOUNTER — Ambulatory Visit: Payer: 59 | Admitting: Internal Medicine

## 2021-03-01 VITALS — BP 110/80 | HR 97 | Temp 98.8°F | Wt 194.2 lb

## 2021-03-01 DIAGNOSIS — N182 Chronic kidney disease, stage 2 (mild): Secondary | ICD-10-CM

## 2021-03-01 DIAGNOSIS — E559 Vitamin D deficiency, unspecified: Secondary | ICD-10-CM

## 2021-03-01 DIAGNOSIS — R7302 Impaired glucose tolerance (oral): Secondary | ICD-10-CM

## 2021-03-01 NOTE — Progress Notes (Signed)
Established Patient Office Visit     This visit occurred during the SARS-CoV-2 public health emergency.  Safety protocols were in place, including screening questions prior to the visit, additional usage of staff PPE, and extensive cleaning of exam room while observing appropriate contact time as indicated for disinfecting solutions.    CC/Reason for Visit: Follow-up chronic medical conditions  HPI: Zoe Morris is a 45 y.o. female who is coming in today for the above mentioned reasons. Past Medical History is significant for: Impaired glucose tolerance, vitamin D deficiency, chronic kidney disease stage II and tobacco use.  She has no acute concerns or complaints today.  She had a colonoscopy in September, she had to reschedule her mammogram.   Past Medical/Surgical History: Past Medical History:  Diagnosis Date   Anxiety    hx of   Asthma    Chronic kidney disease    stage 2   Complete tear of right ACL    Current tear knee, medial meniscus    Depression    hx of   Palpitations    Vitamin D deficiency     Past Surgical History:  Procedure Laterality Date   KNEE ARTHROSCOPY WITH ANTERIOR CRUCIATE LIGAMENT (ACL) REPAIR WITH HAMSTRING GRAFT Right 12/23/2017   Procedure: RIGHT KNEE ARTHROSCOPY WITH ANTERIOR CRUCIATE LIGAMENT (ACL) REPAIR WITH ALLOGRAFT HAMSTRING GRAFT;  Surgeon: Elsie Saas, MD;  Location: [place];  Service: Orthopedics;  Laterality: Right;   KNEE ARTHROSCOPY WITH MEDIAL MENISECTOMY Right 12/23/2017   Procedure: RIGHT KNEE ARTHROSCOPY WITH MEDIAL MENISECTOMY;  Surgeon: Elsie Saas, MD;  Location: [place];  Service: Orthopedics;  Laterality: Right;   TONSILLECTOMY AND ADENOIDECTOMY      Social History:  reports that she has been smoking cigarettes. She has been smoking an average of 1 pack per day. She has never used smokeless tobacco. She reports current alcohol use. She reports that she does not use  drugs.  Allergies: No Known Allergies  Family History:  Family History  Problem Relation Age of Onset   COPD Mother    Multiple myeloma Father    Prostate cancer Paternal Grandfather    Colon polyps Neg Hx    Colon cancer Neg Hx    Esophageal cancer Neg Hx    Stomach cancer Neg Hx    Rectal cancer Neg Hx     No current outpatient medications on file.  Review of Systems:  Constitutional: Denies fever, chills, diaphoresis, appetite change and fatigue.  HEENT: Denies photophobia, eye pain, redness, hearing loss, ear pain, congestion, sore throat, rhinorrhea, sneezing, mouth sores, trouble swallowing, neck pain, neck stiffness and tinnitus.   Respiratory: Denies SOB, DOE, cough, chest tightness,  and wheezing.   Cardiovascular: Denies chest pain, palpitations and leg swelling.  Gastrointestinal: Denies nausea, vomiting, abdominal pain, diarrhea, constipation, blood in stool and abdominal distention.  Genitourinary: Denies dysuria, urgency, frequency, hematuria, flank pain and difficulty urinating.  Endocrine: Denies: hot or cold intolerance, sweats, changes in hair or nails, polyuria, polydipsia. Musculoskeletal: Denies myalgias, back pain, joint swelling, arthralgias and gait problem.  Skin: Denies pallor, rash and wound.  Neurological: Denies dizziness, seizures, syncope, weakness, light-headedness, numbness and headaches.  Hematological: Denies adenopathy. Easy bruising, personal or family bleeding history  Psychiatric/Behavioral: Denies suicidal ideation, mood changes, confusion, nervousness, sleep disturbance and agitation    Physical Exam: Vitals:   03/01/21 1500  BP: 110/80  Pulse: 97  Temp: 98.8 F (37.1 C)  TempSrc: Oral  SpO2:  99%  Weight: 194 lb 3.2 oz (88.1 kg)    Body mass index is 35.52 kg/m.   Constitutional: NAD, calm, comfortable Eyes: PERRL, lids and conjunctivae normal ENMT: Mucous membranes are moist.  Respiratory: clear to auscultation  bilaterally, no wheezing, no crackles. Normal respiratory effort. No accessory muscle use.  Cardiovascular: Regular rate and rhythm, no murmurs / rubs / gallops. No extremity edema.  Neurologic: Grossly intact and nonfocal Psychiatric: Normal judgment and insight. Alert and oriented x 3. Normal mood.    Impression and Plan:  IGT (impaired glucose tolerance)  - Plan: Hemoglobin A1c  CKD (chronic kidney disease) stage 2, GFR 60-89 ml/min  - Plan: Comprehensive metabolic panel -Last creatinine was 1.180 and April 2022.  Vitamin D deficiency  - Plan: VITAMIN D 25 Hydroxy (Vit-D Deficiency, Fractures)  Time spent: 21 minutes reviewing chart, interviewing and examining patient and formulating plan of care.   Patient Instructions  -Nice seeing you today!!  -Lab work today; will notify you once results are available.  -Schedule follow up in 6 months.    Lelon Frohlich, MD [facility] at [facility]

## 2021-03-01 NOTE — Patient Instructions (Signed)
-  Nice seeing you today!!  -Lab work today; will notify you once results are available.  -Schedule follow up in 6 months. 

## 2021-03-02 LAB — VITAMIN D 25 HYDROXY (VIT D DEFICIENCY, FRACTURES): Vit D, 25-Hydroxy: 36 ng/mL (ref 30–100)

## 2021-03-02 LAB — COMPREHENSIVE METABOLIC PANEL
AG Ratio: 1.3 (calc) (ref 1.0–2.5)
ALT: 10 U/L (ref 6–29)
AST: 13 U/L (ref 10–35)
Albumin: 3.8 g/dL (ref 3.6–5.1)
Alkaline phosphatase (APISO): 71 U/L (ref 31–125)
BUN/Creatinine Ratio: 11 (calc) (ref 6–22)
BUN: 13 mg/dL (ref 7–25)
CO2: 24 mmol/L (ref 20–32)
Calcium: 9 mg/dL (ref 8.6–10.2)
Chloride: 107 mmol/L (ref 98–110)
Creat: 1.17 mg/dL — ABNORMAL HIGH (ref 0.50–0.99)
Globulin: 2.9 g/dL (calc) (ref 1.9–3.7)
Glucose, Bld: 90 mg/dL (ref 65–99)
Potassium: 4 mmol/L (ref 3.5–5.3)
Sodium: 140 mmol/L (ref 135–146)
Total Bilirubin: 0.3 mg/dL (ref 0.2–1.2)
Total Protein: 6.7 g/dL (ref 6.1–8.1)

## 2021-03-02 LAB — HEMOGLOBIN A1C
Hgb A1c MFr Bld: 5.8 % of total Hgb — ABNORMAL HIGH (ref <5.7)
Mean Plasma Glucose: 120 mg/dL
eAG (mmol/L): 6.6 mmol/L

## 2021-03-07 ENCOUNTER — Telehealth: Payer: 59 | Admitting: Physician Assistant

## 2021-03-07 DIAGNOSIS — Z202 Contact with and (suspected) exposure to infections with a predominantly sexual mode of transmission: Secondary | ICD-10-CM | POA: Diagnosis not present

## 2021-03-07 DIAGNOSIS — R1033 Periumbilical pain: Secondary | ICD-10-CM | POA: Diagnosis not present

## 2021-03-07 DIAGNOSIS — A549 Gonococcal infection, unspecified: Secondary | ICD-10-CM | POA: Diagnosis not present

## 2021-03-07 DIAGNOSIS — Z7251 High risk heterosexual behavior: Secondary | ICD-10-CM | POA: Diagnosis not present

## 2021-03-07 DIAGNOSIS — R300 Dysuria: Secondary | ICD-10-CM | POA: Diagnosis not present

## 2021-03-07 NOTE — Progress Notes (Signed)
Virtual Visit Consent   Zoe Morris, you are scheduled for a virtual visit with a [facility] provider today.     Just as with appointments in the office, your consent must be obtained to participate.  Your consent will be active for this visit and any virtual visit you may have with one of our providers in the next 365 days.     If you have a MyChart account, a copy of this consent can be sent to you electronically.  All virtual visits are billed to your insurance company just like a traditional visit in the office.    As this is a virtual visit, video technology does not allow for your provider to perform a traditional examination.  This may limit your provider's ability to fully assess your condition.  If your provider identifies any concerns that need to be evaluated in person or the need to arrange testing (such as labs, EKG, etc.), we will make arrangements to do so.     Although advances in technology are sophisticated, we cannot ensure that it will always work on either your end or our end.  If the connection with a video visit is poor, the visit may have to be switched to a telephone visit.  With either a video or telephone visit, we are not always able to ensure that we have a secure connection.     I need to obtain your verbal consent now.   Are you willing to proceed with your visit today?    TAEGAN HAIDER has provided verbal consent on 03/07/2021 for a virtual visit (video or telephone).   Zoe Morris, [place]   Date: 03/07/2021 12:00 PM   Virtual Visit via Video Note   I, Zoe Morris, connected with  Zoe Morris  ([id number], 11-01-1975) on 03/07/21 at 12:00 PM EDT by a video-enabled telemedicine application and verified that I am speaking with the correct person using two identifiers.  Location: Patient: Virtual Visit Location Patient: Home Provider: Virtual Visit Location Provider: Home Office   I discussed the limitations of evaluation and  management by telemedicine and the availability of in person appointments. The patient expressed understanding and agreed to proceed.    History of Present Illness: Zoe Morris is a 45 y.o. who identifies as a female who was assigned female at birth, and is being seen today for abdominal pain. . Notes abdominal pain throughout the night last night. Felt a little mild discomfort prior to bed but worsened overnight. Pain is described as cramping but not alleviated with movement/position change. Pain is periumbilical and lower. Better now but still present, worse with lying down. Felt abdomen was sensitive to touch. Is ending her menstrual period and unsure if related. Denies any overlying skin changes. Denies nausea or vomiting. When laying down she notes a pain when taking breath. Has been dealing with some ongoing constipation but this pain feels quite different for her. Denis change to urinary habits.   HPI: HPI  Problems:  Patient Active Problem List   Diagnosis Date Noted   IGT (impaired glucose tolerance) 02/29/2020   CKD (chronic kidney disease) stage 2, GFR 60-89 ml/min 02/29/2020   Tobacco abuse 02/28/2020   Vitamin D deficiency 02/28/2020   Palpitations    Asthma    Complete tear of right ACL    Current tear knee, medial meniscus     Allergies: No Known Allergies Medications: No current outpatient medications on file.  Observations/Objective: Patient is  well-developed, well-nourished in no acute distress.  Resting comfortably at home.  Head is normocephalic, atraumatic.  No labored breathing. Speech is clear and coherent with logical content.  Patient is alert and oriented at baseline.   Assessment and Plan: 1. Periumbilical pain Sudden onset last night. Severe overnight. Somewhat improved currently but still significant. No fever, chills, aches, vomiting, pelvic pain, vaginal or urinary symptoms. Could be related to constipation and spasm in the intestines/colon,  trying to move things along. However giving abrupt onset and severity of symptoms she needs in-person evaluation. She is going to contact PCP first. If unavailable she is to at least be seen at [facility] for examination. ER for any recurrence of severe symptoms.   Follow Up Instructions: I discussed the assessment and treatment plan with the patient. The patient was provided an opportunity to ask questions and all were answered. The patient agreed with the plan and demonstrated an understanding of the instructions.  A copy of instructions were sent to the patient via MyChart unless otherwise noted below.   The patient was advised to call back or seek an in-person evaluation if the symptoms worsen or if the condition fails to improve as anticipated.  Time:  I spent 10 minutes with the patient via telehealth technology discussing the above problems/concerns.    Zoe Rio, PA-C

## 2021-03-07 NOTE — Patient Instructions (Signed)
  Zoe Morris, thank you for joining [name], PA-C for today's virtual visit.  While this provider is not your primary care provider (PCP), if your PCP is located in our provider database this encounter information will be shared with them immediately following your visit.  Consent: (Patient) Zoe Morris provided verbal consent for this virtual visit at the beginning of the encounter.  Current Medications: No current outpatient medications on file.   Medications ordered in this encounter:  No orders of the defined types were placed in this encounter.    *If you need refills on other medications prior to your next appointment, please contact your pharmacy*  Follow-Up: Call back or seek an in-person evaluation if the symptoms worsen or if the condition fails to improve as anticipated.  Other Instructions Please contact PCP ASAP as discussed. If unavailable I want you seen today at an Urgent Care -- see below. ER for any worsening symptoms.    If you have been instructed to have an in-person evaluation today at a local Urgent Care facility, please use the link below. It will take you to a list of all of our available [facility], including address, phone number and hours of operation. Please do not delay care.  [facility]  If you or a family member do not have a primary care provider, use the link below to schedule a visit and establish care. When you choose a [place] primary care physician or advanced practice provider, you gain a long-term partner in health. Find a Primary Care Provider  Learn more about [facility]'s in-office and virtual care options: [facility]

## 2021-03-12 LAB — HM MAMMOGRAPHY

## 2021-03-13 DIAGNOSIS — D259 Leiomyoma of uterus, unspecified: Secondary | ICD-10-CM | POA: Diagnosis not present

## 2021-03-13 DIAGNOSIS — Z1231 Encounter for screening mammogram for malignant neoplasm of breast: Secondary | ICD-10-CM | POA: Diagnosis not present

## 2021-03-13 DIAGNOSIS — Z3040 Encounter for surveillance of contraceptives, unspecified: Secondary | ICD-10-CM | POA: Diagnosis not present

## 2021-03-13 DIAGNOSIS — Z113 Encounter for screening for infections with a predominantly sexual mode of transmission: Secondary | ICD-10-CM | POA: Diagnosis not present

## 2021-03-13 DIAGNOSIS — Z01419 Encounter for gynecological examination (general) (routine) without abnormal findings: Secondary | ICD-10-CM | POA: Diagnosis not present

## 2021-08-20 ENCOUNTER — Encounter: Payer: Self-pay | Admitting: Internal Medicine

## 2021-08-20 ENCOUNTER — Ambulatory Visit (INDEPENDENT_AMBULATORY_CARE_PROVIDER_SITE_OTHER): Payer: 59 | Admitting: Internal Medicine

## 2021-08-20 VITALS — Temp 98.4°F | Wt 181.7 lb

## 2021-08-20 DIAGNOSIS — M25512 Pain in left shoulder: Secondary | ICD-10-CM | POA: Diagnosis not present

## 2021-08-20 DIAGNOSIS — R7302 Impaired glucose tolerance (oral): Secondary | ICD-10-CM | POA: Diagnosis not present

## 2021-08-20 DIAGNOSIS — G8929 Other chronic pain: Secondary | ICD-10-CM

## 2021-08-20 DIAGNOSIS — N182 Chronic kidney disease, stage 2 (mild): Secondary | ICD-10-CM | POA: Diagnosis not present

## 2021-08-20 LAB — MICROALBUMIN / CREATININE URINE RATIO
Creatinine,U: 153 mg/dL
Microalb Creat Ratio: 0.5 mg/g (ref 0.0–30.0)
Microalb, Ur: <0.7 mg/dL (ref 0.0–1.9)

## 2021-08-20 LAB — POCT GLYCOSYLATED HEMOGLOBIN (HGB A1C): Hemoglobin A1C: 5.7 % — AB (ref 4.0–5.6)

## 2021-08-20 NOTE — Progress Notes (Signed)
? ? ? ?Acute office Visit ? ? ? ? ?This visit occurred during the SARS-CoV-2 public health emergency.  Safety protocols were in place, including screening questions prior to the visit, additional usage of staff PPE, and extensive cleaning of exam room while observing appropriate contact time as indicated for disinfecting solutions.  ? ? ?CC/Reason for Visit: Left shoulder pain ? ?HPI: Zoe Morris is a 46 y.o. female who is coming in today for the above mentioned reasons.  For the past few weeks she has been having left shoulder pain.  It is not constant.  She has not been able to isolate a movement that causes it.  No injury that she can recall.  She has full range of motion. ? ?Past Medical/Surgical History: ?Past Medical History:  ?Diagnosis Date  ? Anxiety   ? hx of  ? Asthma   ? Chronic kidney disease   ? stage 2  ? Complete tear of right ACL   ? Current tear knee, medial meniscus   ? Depression   ? hx of  ? Palpitations   ? Vitamin D deficiency   ? ? ?Past Surgical History:  ?Procedure Laterality Date  ? KNEE ARTHROSCOPY WITH ANTERIOR CRUCIATE LIGAMENT (ACL) REPAIR WITH HAMSTRING GRAFT Right 12/23/2017  ? Procedure: RIGHT KNEE ARTHROSCOPY WITH ANTERIOR CRUCIATE LIGAMENT (ACL) REPAIR WITH ALLOGRAFT HAMSTRING GRAFT;  Surgeon: Elsie Saas, MD;  Location: [facility];  Service: Orthopedics;  Laterality: Right;  ? KNEE ARTHROSCOPY WITH MEDIAL MENISECTOMY Right 12/23/2017  ? Procedure: RIGHT KNEE ARTHROSCOPY WITH MEDIAL MENISECTOMY;  Surgeon: Elsie Saas, MD;  Location: [facility];  Service: Orthopedics;  Laterality: Right;  ? TONSILLECTOMY AND ADENOIDECTOMY    ? ? ?Social History: ? reports that she has been smoking cigarettes. She has been smoking an average of 1 pack per day. She has never used smokeless tobacco. She reports current alcohol use. She reports that she does not use drugs. ? ?Allergies: ?No Known Allergies ? ?Family History:  ?Family History  ?Problem  Relation Age of Onset  ? COPD Mother   ? Multiple myeloma Father   ? Prostate cancer Paternal Grandfather   ? Colon polyps Neg Hx   ? Colon cancer Neg Hx   ? Esophageal cancer Neg Hx   ? Stomach cancer Neg Hx   ? Rectal cancer Neg Hx   ? ? ?No current outpatient medications on file. ? ?Review of Systems:  ?Constitutional: Denies fever, chills, diaphoresis, appetite change and fatigue.  ?HEENT: Denies photophobia, eye pain, redness, hearing loss, ear pain, congestion, sore throat, rhinorrhea, sneezing, mouth sores, trouble swallowing, neck pain, neck stiffness and tinnitus.   ?Respiratory: Denies SOB, DOE, cough, chest tightness,  and wheezing.   ?Cardiovascular: Denies chest pain, palpitations and leg swelling.  ?Gastrointestinal: Denies nausea, vomiting, abdominal pain, diarrhea, constipation, blood in stool and abdominal distention.  ?Genitourinary: Denies dysuria, urgency, frequency, hematuria, flank pain and difficulty urinating.  ?Endocrine: Denies: hot or cold intolerance, sweats, changes in hair or nails, polyuria, polydipsia. ?Musculoskeletal: Denies myalgias, back pain, joint swelling, and gait problem.  ?Skin: Denies pallor, rash and wound.  ?Neurological: Denies dizziness, seizures, syncope, weakness, light-headedness, numbness and headaches.  ?Hematological: Denies adenopathy. Easy bruising, personal or family bleeding history  ?Psychiatric/Behavioral: Denies suicidal ideation, mood changes, confusion, nervousness, sleep disturbance and agitation ? ? ? ?Physical Exam: ?Vitals:  ? 08/20/21 1026  ?Temp: 98.4 ?F (36.9 ?C)  ?TempSrc: Oral  ?Weight: 181 lb 11.2 oz (82.4 kg)  ? ? ?  Body mass index is 33.23 kg/m?. ? ? ?Constitutional: NAD, calm, comfortable ?Eyes: PERRL, lids and conjunctivae normal ?ENMT: Mucous membranes are moist. ?Respiratory: clear to auscultation bilaterally, no wheezing, no crackles. Normal respiratory effort. No accessory muscle use.  ?Cardiovascular: Regular rate and rhythm, no murmurs  / rubs / gallops. No extremity edema.   ?Musculoskeletal: No edema or crepitus of left shoulder, full range of motion.Marland Kitchen  ?Psychiatric: Normal judgment and insight. Alert and oriented x 3. Normal mood.  ? ? ?Impression and Plan: ? ?IGT (impaired glucose tolerance) ? - Plan: POCT glycosylated hemoglobin (Hb A1C), Microalbumin/Creatinine Ratio, Urine ?-A1c is stable at 5.7. ? ? ? ?Chronic left shoulder pain ?-Unclear etiology, likely some low-grade arthritis.  Have advised conservative therapy with as needed NSAIDs and home exercise program. ? ? ? ?Time spent: 20 minutes reviewing chart, interviewing and examining patient and formulating plan of care. ? ? ? ?Lelon Frohlich, MD ?[facility] ? ? ?

## 2022-02-02 NOTE — Progress Notes (Deleted)
Cardiology Office Note   Date:  02/02/2022   ID:  Zoe Morris, DOB 29-Oct-1975, MRN [id number]  PCP:  Isaac Bliss, Rayford Halsted, MD  Cardiologist:   Dorris Carnes, MD   PT presents for eval of palpitations    History of Present Illness: Zoe Morris is a 46 y.o. female with a history of Palpitations   She has had for a long time   Got better,Was only having occasionally Then, in the past few months, they have gotten worse  Having more frequently  Couple times per day      Worse during the week.  Not as much on the weekend   Spells short lived  No dizziness   She deneis caffeine intake  Says sleep deprivation and dehydration make it worse  When not having spells feels fine   No CP   Breathg is OK    I saw the pt in clinic in Jan 2022    No outpatient medications have been marked as taking for the 02/03/22 encounter (Appointment) with Fay Records, MD.     Allergies:   Patient has no known allergies.   Past Medical History:  Diagnosis Date   Anxiety    hx of   Asthma    Chronic kidney disease    stage 2   Complete tear of right ACL    Current tear knee, medial meniscus    Depression    hx of   Palpitations    Vitamin D deficiency     Past Surgical History:  Procedure Laterality Date   KNEE ARTHROSCOPY WITH ANTERIOR CRUCIATE LIGAMENT (ACL) REPAIR WITH HAMSTRING GRAFT Right 12/23/2017   Procedure: RIGHT KNEE ARTHROSCOPY WITH ANTERIOR CRUCIATE LIGAMENT (ACL) REPAIR WITH ALLOGRAFT HAMSTRING GRAFT;  Surgeon: Elsie Saas, MD;  Location: [facility];  Service: Orthopedics;  Laterality: Right;   KNEE ARTHROSCOPY WITH MEDIAL MENISECTOMY Right 12/23/2017   Procedure: RIGHT KNEE ARTHROSCOPY WITH MEDIAL MENISECTOMY;  Surgeon: Elsie Saas, MD;  Location: [facility];  Service: Orthopedics;  Laterality: Right;   TONSILLECTOMY AND ADENOIDECTOMY       Social History:  The patient  reports that she has been smoking cigarettes. She has  been smoking an average of 1 pack per day. She has never used smokeless tobacco. She reports current alcohol use. She reports that she does not use drugs.   Family History:  The patient's family history includes COPD in her mother; Multiple myeloma in her father; Prostate cancer in her paternal grandfather.    ROS:  Please see the history of present illness. All other systems are reviewed and  Negative to the above problem except as noted.    PHYSICAL EXAM: VS:  There were no vitals taken for this visit.  GEN: Pt is 46 yo  in no acute distress  HEENT: normal  Neck: no JVD, carotid bruits Cardiac: RRR; no murmurs.  NO LE  edema  Respiratory:  clear to auscultation bilaterally, GI: soft, nontender, nondistended, + BS  No hepatomegaly  MS: no deformity Moving all extremities   Skin: warm and dry, no rash Neuro:  Strength and sensation are intact Psych: euthymic mood, full affect   EKG:  EKG is ordered today.  SR 76 bpm   Nonspecfic ST changes   Lipid Panel    Component Value Date/Time   CHOL 162 02/28/2020 0917   CHOL 140 05/02/2019 1231   TRIG 79 02/28/2020 0917   HDL 47 (L) 02/28/2020  0093   HDL 48 05/02/2019 1231   CHOLHDL 3.4 02/28/2020 0917   LDLCALC 98 02/28/2020 0917      Wt Readings from Last 3 Encounters:  08/20/21 181 lb 11.2 oz (82.4 kg)  03/01/21 194 lb 3.2 oz (88.1 kg)  01/25/21 190 lb (86.2 kg)      ASSESSMENT AND PLAN:  1  Palpitations   Pt with hx of palpitatoins   REcently occurring more frequently   Short lived  DO not sound hemodynamically destabilizing   Will set up for monitor to try to capture    Otherwise exam normal   No further testing for now.  Stay hydrated    Stay active    Current medicines are reviewed at length with the patient today.  The patient does not have concerns regarding medicines.  Signed, Dorris Carnes, MD  02/02/2022 9:43 PM    [facility] [place], [place], [place]  [address] Phone: [phone]; Fax: [phone]

## 2022-02-03 ENCOUNTER — Ambulatory Visit: Payer: 59 | Admitting: Internal Medicine

## 2022-02-27 ENCOUNTER — Other Ambulatory Visit (HOSPITAL_COMMUNITY): Payer: Self-pay

## 2022-03-05 ENCOUNTER — Other Ambulatory Visit (HOSPITAL_COMMUNITY): Payer: Self-pay

## 2022-03-05 ENCOUNTER — Ambulatory Visit (INDEPENDENT_AMBULATORY_CARE_PROVIDER_SITE_OTHER): Payer: 59 | Admitting: Internal Medicine

## 2022-03-05 ENCOUNTER — Encounter: Payer: Self-pay | Admitting: Internal Medicine

## 2022-03-05 ENCOUNTER — Other Ambulatory Visit: Payer: Self-pay | Admitting: Internal Medicine

## 2022-03-05 VITALS — BP 122/84 | HR 61 | Temp 98.3°F | Resp 16 | Ht 62.0 in | Wt 183.4 lb

## 2022-03-05 DIAGNOSIS — E559 Vitamin D deficiency, unspecified: Secondary | ICD-10-CM

## 2022-03-05 DIAGNOSIS — N182 Chronic kidney disease, stage 2 (mild): Secondary | ICD-10-CM

## 2022-03-05 DIAGNOSIS — R7302 Impaired glucose tolerance (oral): Secondary | ICD-10-CM | POA: Diagnosis not present

## 2022-03-05 LAB — CBC WITH DIFFERENTIAL/PLATELET
Basophils Absolute: 0.1 10*3/uL (ref 0.0–0.1)
Basophils Relative: 0.8 % (ref 0.0–3.0)
Eosinophils Absolute: 0.2 10*3/uL (ref 0.0–0.7)
Eosinophils Relative: 2.1 % (ref 0.0–5.0)
HCT: 36.6 % (ref 36.0–46.0)
Hemoglobin: 11.7 g/dL — ABNORMAL LOW (ref 12.0–15.0)
Lymphocytes Relative: 32.2 % (ref 12.0–46.0)
Lymphs Abs: 2.9 10*3/uL (ref 0.7–4.0)
MCHC: 31.9 g/dL (ref 30.0–36.0)
MCV: 78.6 fl (ref 78.0–100.0)
Monocytes Absolute: 0.6 10*3/uL (ref 0.1–1.0)
Monocytes Relative: 6.7 % (ref 3.0–12.0)
Neutro Abs: 5.3 10*3/uL (ref 1.4–7.7)
Neutrophils Relative %: 58.2 % (ref 43.0–77.0)
Platelets: 314 10*3/uL (ref 150.0–400.0)
RBC: 4.65 Mil/uL (ref 3.87–5.11)
RDW: 16.5 % — ABNORMAL HIGH (ref 11.5–15.5)
WBC: 9 10*3/uL (ref 4.0–10.5)

## 2022-03-05 LAB — LIPID PANEL
Cholesterol: 140 mg/dL (ref 0–200)
HDL: 48.6 mg/dL (ref >39.00)
LDL Cholesterol: 81 mg/dL (ref 0–99)
NonHDL: 91.62
Total CHOL/HDL Ratio: 3
Triglycerides: 54 mg/dL (ref 0.0–149.0)
VLDL: 10.8 mg/dL (ref 0.0–40.0)

## 2022-03-05 LAB — COMPREHENSIVE METABOLIC PANEL
ALT: 12 U/L (ref 0–35)
AST: 15 U/L (ref 0–37)
Albumin: 3.7 g/dL (ref 3.5–5.2)
Alkaline Phosphatase: 65 U/L (ref 39–117)
BUN: 10 mg/dL (ref 6–23)
CO2: 27 mEq/L (ref 19–32)
Calcium: 8.8 mg/dL (ref 8.4–10.5)
Chloride: 106 mEq/L (ref 96–112)
Creatinine, Ser: 1.08 mg/dL (ref 0.40–1.20)
GFR: 61.02 mL/min (ref >60.00)
Glucose, Bld: 86 mg/dL (ref 70–99)
Potassium: 4.1 mEq/L (ref 3.5–5.1)
Sodium: 139 mEq/L (ref 135–145)
Total Bilirubin: 0.4 mg/dL (ref 0.2–1.2)
Total Protein: 6.8 g/dL (ref 6.0–8.3)

## 2022-03-05 LAB — VITAMIN D 25 HYDROXY (VIT D DEFICIENCY, FRACTURES): VITD: 21.02 ng/mL — ABNORMAL LOW (ref 30.00–100.00)

## 2022-03-05 LAB — HEMOGLOBIN A1C: Hgb A1c MFr Bld: 6.1 % (ref 4.6–6.5)

## 2022-03-05 MED ORDER — VITAMIN D (ERGOCALCIFEROL) 1.25 MG (50000 UNIT) PO CAPS
50000.0000 [IU] | ORAL_CAPSULE | ORAL | 0 refills | Status: AC
  Filled 2022-03-05: qty 12, 84d supply, fill #0

## 2022-03-05 NOTE — Progress Notes (Signed)
Established Patient Office Visit     CC/Reason for Visit: Follow-up chronic conditions  HPI: Zoe Morris is a 46 y.o. female who is coming in today for the above mentioned reasons. Past Medical History is significant for: Impaired glucose tolerance, chronic kidney disease stage II, vitamin D deficiency.  She is feeling well today and has no acute concerns or complaints.  She is overdue for flu and COVID vaccines but declines despite counseling.  She states she has GYN exam and mammogram done with her GYN earlier this year.   Past Medical/Surgical History: Past Medical History:  Diagnosis Date   Anxiety    hx of   Asthma    Chronic kidney disease    stage 2   Complete tear of right ACL    Current tear knee, medial meniscus    Depression    hx of   Palpitations    Vitamin D deficiency     Past Surgical History:  Procedure Laterality Date   KNEE ARTHROSCOPY WITH ANTERIOR CRUCIATE LIGAMENT (ACL) REPAIR WITH HAMSTRING GRAFT Right 12/23/2017   Procedure: RIGHT KNEE ARTHROSCOPY WITH ANTERIOR CRUCIATE LIGAMENT (ACL) REPAIR WITH ALLOGRAFT HAMSTRING GRAFT;  Surgeon: Elsie Saas, MD;  Location: [facility];  Service: Orthopedics;  Laterality: Right;   KNEE ARTHROSCOPY WITH MEDIAL MENISECTOMY Right 12/23/2017   Procedure: RIGHT KNEE ARTHROSCOPY WITH MEDIAL MENISECTOMY;  Surgeon: Elsie Saas, MD;  Location: [facility];  Service: Orthopedics;  Laterality: Right;   TONSILLECTOMY AND ADENOIDECTOMY      Social History:  reports that she has been smoking cigarettes. She has been smoking an average of 1 pack per day. She has never used smokeless tobacco. She reports current alcohol use. She reports that she does not use drugs.  Allergies: No Known Allergies  Family History:  Family History  Problem Relation Age of Onset   COPD Mother    Multiple myeloma Father    Prostate cancer Paternal Grandfather    Colon polyps Neg Hx    Colon cancer  Neg Hx    Esophageal cancer Neg Hx    Stomach cancer Neg Hx    Rectal cancer Neg Hx      Current Outpatient Medications:    paragard intrauterine copper IUD IUD, Take 1 device by intrauterine route., Disp: , Rfl:   Review of Systems:  Constitutional: Denies fever, chills, diaphoresis, appetite change and fatigue.  HEENT: Denies photophobia, eye pain, redness, hearing loss, ear pain, congestion, sore throat, rhinorrhea, sneezing, mouth sores, trouble swallowing, neck pain, neck stiffness and tinnitus.   Respiratory: Denies SOB, DOE, cough, chest tightness,  and wheezing.   Cardiovascular: Denies chest pain, palpitations and leg swelling.  Gastrointestinal: Denies nausea, vomiting, abdominal pain, diarrhea, constipation, blood in stool and abdominal distention.  Genitourinary: Denies dysuria, urgency, frequency, hematuria, flank pain and difficulty urinating.  Endocrine: Denies: hot or cold intolerance, sweats, changes in hair or nails, polyuria, polydipsia. Musculoskeletal: Denies myalgias, back pain, joint swelling, arthralgias and gait problem.  Skin: Denies pallor, rash and wound.  Neurological: Denies dizziness, seizures, syncope, weakness, light-headedness, numbness and headaches.  Hematological: Denies adenopathy. Easy bruising, personal or family bleeding history  Psychiatric/Behavioral: Denies suicidal ideation, mood changes, confusion, nervousness, sleep disturbance and agitation    Physical Exam: Vitals:   03/05/22 0959  BP: 122/84  Pulse: 61  Resp: 16  Temp: 98.3 F (36.8 C)  TempSrc: Oral  SpO2: 99%  Weight: 183 lb 6 oz (83.2 kg)  Height:  _0  (1.575 m)    Body mass index is 33.54 kg/m.   Constitutional: NAD, calm, comfortable Eyes: PERRL, lids and conjunctivae normal ENMT: Mucous membranes are moist.  Respiratory: clear to auscultation bilaterally, no wheezing, no crackles. Normal respiratory effort. No accessory muscle use.  Cardiovascular: Regular rate  and rhythm, no murmurs / rubs / gallops. No extremity edema. Psychiatric: Normal judgment and insight. Alert and oriented x 3. Normal mood.    Impression and Plan:  CKD (chronic kidney disease) stage 2, GFR 60-89 ml/min - Plan: CBC with Differential/Platelet, Comprehensive metabolic panel, Lipid panel  IGT (impaired glucose tolerance) - Plan: Hemoglobin A1c  Vitamin D deficiency - Plan: VITAMIN D 25 Hydroxy (Vit-D Deficiency, Fractures)  -Declines flu vaccine despite counseling. -Will send for record request to [place] for her mammogram and Pap smear results. -Check labs today to follow A1c, vitamin D levels as well as her known chronic kidney disease.  Time spent:31 minutes reviewing chart, interviewing and examining patient and formulating plan of care.      Lelon Frohlich, MD [facility]

## 2022-03-06 ENCOUNTER — Encounter: Payer: Self-pay | Admitting: Internal Medicine

## 2022-04-28 NOTE — Progress Notes (Deleted)
  Pt is  no show for appt

## 2022-04-29 ENCOUNTER — Ambulatory Visit: Payer: 59 | Admitting: Internal Medicine

## 2023-01-20 ENCOUNTER — Ambulatory Visit (INDEPENDENT_AMBULATORY_CARE_PROVIDER_SITE_OTHER): Payer: BC Managed Care – PPO | Admitting: Family Medicine

## 2023-01-20 ENCOUNTER — Encounter: Payer: Self-pay | Admitting: Family Medicine

## 2023-01-20 VITALS — BP 118/76 | HR 85 | Temp 98.3°F | Ht 62.0 in | Wt 198.0 lb

## 2023-01-20 DIAGNOSIS — R2242 Localized swelling, mass and lump, left lower limb: Secondary | ICD-10-CM

## 2023-01-20 LAB — D-DIMER, QUANTITATIVE: D-Dimer, Quant: 0.82 ug{FEU}/mL — ABNORMAL HIGH (ref <0.50)

## 2023-01-20 NOTE — Assessment & Plan Note (Signed)
This localized area of swelling and tenderness appears consistent with a superficial phlebitis with some possible varicose veins. I have a low suspicion for DVT. I will check a D-dimer. If positive, I will request venous doppler ultrasound. Otherwise, I recommend she apply heat to the area and consider taking ibuprofen until improved.

## 2023-01-20 NOTE — Progress Notes (Signed)
[facility] [address] [place] [address] Dept: [phone] Dept Fax: [phone]  Office Visit  Subjective:    Patient ID: Zoe Morris, female    DOB: 10-14-1975, 47 y.o..   MRN: [id number]  Chief Complaint  Patient presents with   Leg Pain    C/o having LT leg swelling/pain x 1 day.     History of Present Illness:  Patient is in today with swelling of her left lower leg this morning. She notes this was present after she got up today. She noted some swelling just distal to her knee on the medial side. The swelling has gone down as the day has progressed. She is having less tenderness now. she denies any shortness of breath. She has not past history of DVT or PE. She does not recall doing any activity outside of normal yesterday. 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  Past Medical History: Patient Active Problem List   Diagnosis Date Noted   IGT (impaired glucose tolerance) 02/29/2020   CKD (chronic kidney disease) stage 2, GFR 60-89 ml/min 02/29/2020   Tobacco abuse 02/28/2020   Vitamin D deficiency 02/28/2020   Palpitations    Asthma    Complete tear of right ACL    Current tear knee, medial meniscus    Past Surgical History:  Procedure Laterality Date   KNEE ARTHROSCOPY WITH ANTERIOR CRUCIATE LIGAMENT (ACL) REPAIR WITH HAMSTRING GRAFT Right 12/23/2017   Procedure: RIGHT KNEE ARTHROSCOPY WITH ANTERIOR CRUCIATE LIGAMENT (ACL) REPAIR WITH ALLOGRAFT HAMSTRING GRAFT;  Surgeon: Salvatore Marvel, MD;  Location: [facility];  Service: Orthopedics;  Laterality: Right;   KNEE ARTHROSCOPY WITH MEDIAL MENISECTOMY Right 12/23/2017   Procedure: RIGHT KNEE ARTHROSCOPY WITH MEDIAL MENISECTOMY;  Surgeon: Salvatore Marvel, MD;  [facility];  Service: Orthopedics;  Laterality: Right;   TONSILLECTOMY AND ADENOIDECTOMY     Family History  Problem Relation Age of  Onset   COPD Mother    Multiple myeloma Father    Prostate cancer Paternal Grandfather    Colon polyps Neg Hx    Colon cancer Neg Hx    Esophageal cancer Neg Hx    Stomach cancer Neg Hx    Rectal cancer Neg Hx    Outpatient Medications Prior to Visit  Medication Sig Dispense Refill   paragard intrauterine copper IUD IUD Take 1 device by intrauterine route.     No facility-administered medications prior to visit.   No Known Allergies   Objective:   Today's Vitals   01/20/23 1337  BP: (!) 128/92  Pulse: 85  Temp: 98.3 F (36.8 C)  TempSrc: Temporal  SpO2: 99%  Weight: 198 lb (89.8 kg)  Height: 5\' 2"  (1.575 m)   Body mass index is 36.21 kg/m.   General: Well developed, well nourished. No acute distress. Extremities: Full ROM of LLE. There is a localized area of swelling over the medial aspect of the lower leg, below the   knee. This has some slight increased warmth. It also feels more puffy or ballotable. No edema noted. No obviosu   redness. Psych: Alert and oriented. Normal mood and affect.  Health Maintenance Due  Topic Date Due   HIV Screening  Never done   Hepatitis C Screening  Never done   INFLUENZA VACCINE  12/25/2022   PAP SMEAR-Modifier  02/21/2023     Assessment & Plan:   Problem List Items Addressed This Visit  Other   Localized swelling of left lower leg - Primary    This localized area of swelling and tenderness appears consistent with a superficial phlebitis with some possible varicose veins. I have a low suspicion for DVT. I will check a D-dimer. If positive, I will request venous doppler ultrasound. Otherwise, I recommend she apply heat to the area and consider taking ibuprofen until improved.      Relevant Orders   D-dimer, quantitative    Return if symptoms worsen or fail to improve.   Loyola Mast, MD

## 2023-01-20 NOTE — Addendum Note (Signed)
Addended by: Loyola Mast on: 01/20/2023 04:52 PM   Modules accepted: Orders

## 2023-01-21 ENCOUNTER — Encounter: Payer: Self-pay | Admitting: Internal Medicine

## 2023-01-21 ENCOUNTER — Ambulatory Visit (HOSPITAL_COMMUNITY)
Admission: RE | Admit: 2023-01-21 | Discharge: 2023-01-21 | Disposition: A | Discharge status: Home / Self Care | Payer: BC Managed Care – PPO | Source: Ambulatory Visit | Attending: Cardiology | Admitting: Cardiology

## 2023-01-21 ENCOUNTER — Ambulatory Visit: Payer: Self-pay | Admitting: Internal Medicine

## 2023-01-21 DIAGNOSIS — R2242 Localized swelling, mass and lump, left lower limb: Secondary | ICD-10-CM | POA: Diagnosis not present

## 2023-01-22 ENCOUNTER — Encounter: Payer: Self-pay | Admitting: Family Medicine

## 2023-01-29 ENCOUNTER — Ambulatory Visit: Payer: BC Managed Care – PPO | Attending: Internal Medicine | Admitting: Internal Medicine

## 2023-01-29 ENCOUNTER — Encounter: Payer: Self-pay | Admitting: Internal Medicine

## 2023-01-29 ENCOUNTER — Other Ambulatory Visit (HOSPITAL_COMMUNITY)
Admission: RE | Admit: 2023-01-29 | Discharge: 2023-01-29 | Disposition: A | Discharge status: Home / Self Care | Payer: BC Managed Care – PPO | Source: Ambulatory Visit | Attending: Internal Medicine | Admitting: Internal Medicine

## 2023-01-29 VITALS — BP 130/70 | HR 71 | Ht 62.0 in | Wt 196.6 lb

## 2023-01-29 DIAGNOSIS — Z13220 Encounter for screening for lipoid disorders: Secondary | ICD-10-CM

## 2023-01-29 DIAGNOSIS — Z79899 Other long term (current) drug therapy: Secondary | ICD-10-CM

## 2023-01-29 DIAGNOSIS — R0602 Shortness of breath: Secondary | ICD-10-CM

## 2023-01-29 DIAGNOSIS — R002 Palpitations: Secondary | ICD-10-CM | POA: Diagnosis not present

## 2023-01-29 DIAGNOSIS — N182 Chronic kidney disease, stage 2 (mild): Secondary | ICD-10-CM | POA: Diagnosis not present

## 2023-01-29 DIAGNOSIS — Z131 Encounter for screening for diabetes mellitus: Secondary | ICD-10-CM | POA: Insufficient documentation

## 2023-01-29 LAB — CBC
HCT: 39.8 % (ref 36.0–46.0)
Hemoglobin: 12.5 g/dL (ref 12.0–15.0)
MCH: 25.5 pg — ABNORMAL LOW (ref 26.0–34.0)
MCHC: 31.4 g/dL (ref 30.0–36.0)
MCV: 81.1 fL (ref 80.0–100.0)
Platelets: 393 10*3/uL (ref 150–400)
RBC: 4.91 MIL/uL (ref 3.87–5.11)
RDW: 16.4 % — ABNORMAL HIGH (ref 11.5–15.5)
WBC: 8.8 10*3/uL (ref 4.0–10.5)
nRBC: 0 % (ref 0.0–0.2)

## 2023-01-29 LAB — BASIC METABOLIC PANEL
Anion gap: 6 (ref 5–15)
BUN: 10 mg/dL (ref 6–20)
CO2: 25 mmol/L (ref 22–32)
Calcium: 8.6 mg/dL — ABNORMAL LOW (ref 8.9–10.3)
Chloride: 104 mmol/L (ref 98–111)
Creatinine, Ser: 1.17 mg/dL — ABNORMAL HIGH (ref 0.44–1.00)
GFR, Estimated: 58 mL/min — ABNORMAL LOW (ref >60)
Glucose, Bld: 94 mg/dL (ref 70–99)
Potassium: 4 mmol/L (ref 3.5–5.1)
Sodium: 135 mmol/L (ref 135–145)

## 2023-01-29 LAB — HEMOGLOBIN A1C
Hgb A1c MFr Bld: 6.1 % — ABNORMAL HIGH (ref 4.8–5.6)
Mean Plasma Glucose: 128.37 mg/dL

## 2023-01-29 LAB — VITAMIN D 25 HYDROXY (VIT D DEFICIENCY, FRACTURES): Vit D, 25-Hydroxy: 33.09 ng/mL (ref 30–100)

## 2023-01-29 LAB — TSH: TSH: 1.422 u[IU]/mL (ref 0.350–4.500)

## 2023-01-29 NOTE — Patient Instructions (Signed)
Medication Instructions:  Your physician recommends that you continue on your current medications as directed. Please refer to the Current Medication list given to you today.  *If you need a refill on your cardiac medications before your next appointment, please call your pharmacy*   Lab Work: Your physician recommends that you return for lab work in: Today    If you have labs (blood work) drawn today and your tests are completely normal, you will receive your results only by: MyChart Message (if you have MyChart) OR A paper copy in the mail If you have any lab test that is abnormal or we need to change your treatment, we will call you to review the results.   Testing/Procedures: Your physician has requested that you have an echocardiogram. Echocardiography is a painless test that uses sound waves to create images of your heart. It provides your doctor with information about the size and shape of your heart and how well your heart's chambers and valves are working. This procedure takes approximately one hour. There are no restrictions for this procedure. Please do NOT wear cologne, perfume, aftershave, or lotions (deodorant is allowed). Please arrive 15 minutes prior to your appointment time.    Follow-Up: At [facility], you and your health needs are our priority.  As part of our continuing mission to provide you with exceptional heart care, we have created designated Provider Care Teams.  These Care Teams include your primary Cardiologist (physician) and Advanced Practice Providers (APPs -  Physician Assistants and Nurse Practitioners) who all work together to provide you with the care you need, when you need it.  We recommend signing up for the patient portal called "MyChart".  Sign up information is provided on this After Visit Summary.  MyChart is used to connect with patients for Virtual Visits (Telemedicine).  Patients are able to view lab/test results, encounter notes,  upcoming appointments, etc.  Non-urgent messages can be sent to your provider as well.   To learn more about what you can do with MyChart, go to [email or web address].    Your next appointment:   1 year(s)  Provider:   You may see Dietrich Pates, MD or one of the following Advanced Practice Providers on your designated Care Team:   Randall An, PA-C  Jacolyn Reedy, PA-C     Other Instructions Thank you for choosing [facility]!

## 2023-01-29 NOTE — Progress Notes (Signed)
Cardiology Office Note   Date:  01/29/2023   ID:  Zoe Morris, DOB 18-Nov-1975, MRN [id number]  PCP:  Philip Aspen, Limmie Patricia, MD  Cardiologist:   Dietrich Pates, MD   PT presents for eval palpitations    History of Present Illness: Zoe Morris is a 47 y.o. female with a history palpitations   Seen initially in 2022.   Monitor showed SR with rare PACs, PVCs    Recomm trial of Toprol XL    Since I saw her she denies palpitations    Denies CP   She does complain of SOB with activity, mostly with stairs  Says she feels like her heart is going to give out   Avoids stairs because of it     Diet:  Notes that it is poor   Blames on work   Metallurgist, french fries, hamburgers   Current Meds  Medication Sig   paragard intrauterine copper IUD IUD Take 1 device by intrauterine route.     Allergies:   Patient has no known allergies.   Past Medical History:  Diagnosis Date   Anxiety    hx of   Asthma    Chronic kidney disease    stage 2   Complete tear of right ACL    Current tear knee, medial meniscus    Depression    hx of   Palpitations    Vitamin D deficiency     Past Surgical History:  Procedure Laterality Date   KNEE ARTHROSCOPY WITH ANTERIOR CRUCIATE LIGAMENT (ACL) REPAIR WITH HAMSTRING GRAFT Right 12/23/2017   Procedure: RIGHT KNEE ARTHROSCOPY WITH ANTERIOR CRUCIATE LIGAMENT (ACL) REPAIR WITH ALLOGRAFT HAMSTRING GRAFT;  Surgeon: Salvatore Marvel, MD;  Location: [facility];  Service: Orthopedics;  Laterality: Right;   KNEE ARTHROSCOPY WITH MEDIAL MENISECTOMY Right 12/23/2017   Procedure: RIGHT KNEE ARTHROSCOPY WITH MEDIAL MENISECTOMY;  Surgeon: Salvatore Marvel, MD;  Location: [facility];  Service: Orthopedics;  Laterality: Right;   TONSILLECTOMY AND ADENOIDECTOMY       Social History:  The patient  reports that she has been smoking cigarettes. She has never used smokeless tobacco. She reports current alcohol use. She reports  that she does not use drugs.   Family History:  The patient's family history includes COPD in her mother; Multiple myeloma in her father; Prostate cancer in her paternal grandfather.    ROS:  Please see the history of present illness. All other systems are reviewed and  Negative to the above problem except as noted.    PHYSICAL EXAM: VS:  BP 130/70 (BP Location: Left Arm, Patient Position: Sitting, Cuff Size: Normal)   Pulse 71   Ht 5\' 2"  (1.575 m)   Wt 196 lb 9.6 oz (89.2 kg)   SpO2 100%   BMI 35.96 kg/m   GEN: Pt is an obese 47 yo  in no acute distress  HEENT: normal  Neck: no JVD, carotid bruits Cardiac: RRR; no murmur    NO LE  edema  Respiratory:  clear to auscultation  GI: soft, nontender, nondistended  No hepatomegaly  MS: no deformity Moving all extremities     EKG:  EKG is  ordered today.  NSR   71 bpm  Nospecific ST changes     Lipid Panel  Wt Readings from Last 3 Encounters:  01/29/23 196 lb 9.6 oz (89.2 kg)  01/20/23 198 lb (89.8 kg)  03/05/22 183 lb 6 oz (83.2 kg)  ASSESSMENT AND PLAN:  1  Palpitations  Pt denies significant palpitations   2 DOE   Pt with signif dyspnea with stairs   Does not do much exercising    Will get echo to evalaute LV systolic/diastolic function   COnsider GXT to evaluate tolerance, bp and HR response  Labs today     3  Metabolics  Diet needs to improve  Minimize processed food    Gave sites (Zoe, LEVELS metabolism) Videos for sugar     Check:  A1C, lipomed, ApoB, Lpa, BMET, CBC, TSH and VIt D    Follow up based on test results    Current medicines are reviewed at length with the patient today.  The patient does not have concerns regarding medicines.  Signed, Dietrich Pates, MD  01/29/2023 11:07 AM    [facility] [address] [place], [place], [place]  [address] Phone: [phone]; Fax: [phone]

## 2023-01-30 LAB — NMR, LIPOPROFILE
Cholesterol, Total: 168 mg/dL (ref 100–199)
HDL Cholesterol by NMR: 50 mg/dL (ref >39)
HDL Particle Number: 29.4 umol/L — ABNORMAL LOW (ref >=30.5)
LDL Particle Number: 1401 nmol/L — ABNORMAL HIGH (ref <1000)
LDL Size: 21.1 nm (ref >20.5)
LDL-C (NIH Calc): 105 mg/dL — ABNORMAL HIGH (ref 0–99)
LP-IR Score: 27 (ref <=45)
Small LDL Particle Number: 612 nmol/L — ABNORMAL HIGH (ref <=527)
Triglycerides by NMR: 68 mg/dL (ref 0–149)

## 2023-01-30 LAB — MISC LABCORP TEST (SEND OUT): Labcorp test code: 167015

## 2023-01-30 LAB — LIPOPROTEIN A (LPA): Lipoprotein (a): 194.1 nmol/L — ABNORMAL HIGH (ref <75.0)

## 2023-02-26 ENCOUNTER — Ambulatory Visit (HOSPITAL_COMMUNITY)
Admission: RE | Admit: 2023-02-26 | Discharge: 2023-02-26 | Disposition: A | Discharge status: Home / Self Care | Payer: BC Managed Care – PPO | Source: Ambulatory Visit | Attending: Internal Medicine | Admitting: Internal Medicine

## 2023-02-26 DIAGNOSIS — R0602 Shortness of breath: Secondary | ICD-10-CM | POA: Diagnosis not present

## 2023-02-26 LAB — ECHOCARDIOGRAM COMPLETE
AR max vel: 2.44 cm2
AV Area VTI: 2.58 cm2
AV Area mean vel: 2.74 cm2
AV Mean grad: 3.5 mm[Hg]
AV Peak grad: 8.2 mm[Hg]
Ao pk vel: 1.43 m/s
Area-P 1/2: 3.6 cm2
S' Lateral: 1.9 cm

## 2023-02-26 NOTE — Progress Notes (Signed)
*  PRELIMINARY RESULTS* Echocardiogram 2D Echocardiogram has been performed.  Stacey Drain 02/26/2023, 12:38 PM

## 2023-03-03 ENCOUNTER — Telehealth: Payer: Self-pay | Admitting: *Deleted

## 2023-03-03 DIAGNOSIS — R002 Palpitations: Secondary | ICD-10-CM

## 2023-03-03 DIAGNOSIS — R0602 Shortness of breath: Secondary | ICD-10-CM

## 2023-03-03 NOTE — Telephone Encounter (Signed)
Pt notified and order placed 

## 2023-03-03 NOTE — Telephone Encounter (Signed)
-----   Message from Nurse Dewayne Hatch B sent at 03/03/2023 10:59 AM EDT -----  ----- Message ----- From: Pricilla Riffle, MD Sent: 03/02/2023   4:03 PM EDT To: Bertram Millard, RN  Echo shows normal LV and RV pumping function Normal valve function    I would recomm sress echo to evaluate LV response to exercise as well as overall exercise tolerance

## 2023-04-13 ENCOUNTER — Ambulatory Visit (HOSPITAL_COMMUNITY): Admission: RE | Admit: 2023-04-13 | Payer: BC Managed Care – PPO | Source: Ambulatory Visit

## 2023-04-14 ENCOUNTER — Ambulatory Visit (INDEPENDENT_AMBULATORY_CARE_PROVIDER_SITE_OTHER): Payer: BC Managed Care – PPO | Admitting: Internal Medicine

## 2023-04-14 VITALS — BP 110/84 | HR 84 | Temp 99.0°F | Wt 194.2 lb

## 2023-04-14 DIAGNOSIS — N182 Chronic kidney disease, stage 2 (mild): Secondary | ICD-10-CM

## 2023-04-14 DIAGNOSIS — R7302 Impaired glucose tolerance (oral): Secondary | ICD-10-CM | POA: Diagnosis not present

## 2023-04-14 DIAGNOSIS — E669 Obesity, unspecified: Secondary | ICD-10-CM

## 2023-04-14 DIAGNOSIS — R002 Palpitations: Secondary | ICD-10-CM | POA: Diagnosis not present

## 2023-04-14 NOTE — Progress Notes (Signed)
Established Patient Office Visit     CC/Reason for Visit: Follow-up chronic conditions  HPI: Zoe Morris is a 47 y.o. female who is coming in today for the above mentioned reasons. Past Medical History is significant for: Impaired glucose tolerance, chronic kidney disease stage II, vitamin D deficiency and history of palpitations.  She is feeling well.  She does had complete lab work done in September at her cardiologist office.  Her A1c is 6.1, cholesterol is slightly elevated with a total cholesterol of 168 and LDL 105.  She will see her GYN in December to update Pap smear and mammogram.   Past Medical/Surgical History: Past Medical History:  Diagnosis Date   Anxiety    hx of   Asthma    Chronic kidney disease    stage 2   Complete tear of right ACL    Current tear knee, medial meniscus    Depression    hx of   Palpitations    Vitamin D deficiency     Past Surgical History:  Procedure Laterality Date   KNEE ARTHROSCOPY WITH ANTERIOR CRUCIATE LIGAMENT (ACL) REPAIR WITH HAMSTRING GRAFT Right 12/23/2017   Procedure: RIGHT KNEE ARTHROSCOPY WITH ANTERIOR CRUCIATE LIGAMENT (ACL) REPAIR WITH ALLOGRAFT HAMSTRING GRAFT;  Surgeon: Salvatore Marvel, MD;  Location: [facility];  Service: Orthopedics;  Laterality: Right;   KNEE ARTHROSCOPY WITH MEDIAL MENISECTOMY Right 12/23/2017   Procedure: RIGHT KNEE ARTHROSCOPY WITH MEDIAL MENISECTOMY;  Surgeon: Salvatore Marvel, MD;  Location: [facility];  Service: Orthopedics;  Laterality: Right;   TONSILLECTOMY AND ADENOIDECTOMY      Social History:  reports that she has been smoking cigarettes. She has never used smokeless tobacco. She reports current alcohol use. She reports that she does not use drugs.  Allergies: No Known Allergies  Family History:  Family History  Problem Relation Age of Onset   COPD Mother    Multiple myeloma Father    Prostate cancer Paternal Grandfather    Colon polyps Neg Hx     Colon cancer Neg Hx    Esophageal cancer Neg Hx    Stomach cancer Neg Hx    Rectal cancer Neg Hx      Current Outpatient Medications:    paragard intrauterine copper IUD IUD, Take 1 device by intrauterine route., Disp: , Rfl:   Review of Systems:  Negative unless indicated in HPI.   Physical Exam: Vitals:   04/14/23 1055  BP: 110/84  Pulse: 84  Temp: 99 F (37.2 C)  TempSrc: Oral  SpO2: 98%  Weight: 194 lb 3.2 oz (88.1 kg)    Body mass index is 35.52 kg/m.   Physical Exam Vitals reviewed.  Constitutional:      Appearance: Normal appearance.  HENT:     Head: Normocephalic and atraumatic.  Eyes:     Conjunctiva/sclera: Conjunctivae normal.     Pupils: Pupils are equal, round, and reactive to light.  Cardiovascular:     Rate and Rhythm: Normal rate and regular rhythm.  Pulmonary:     Effort: Pulmonary effort is normal.     Breath sounds: Normal breath sounds.  Skin:    General: Skin is warm and dry.  Neurological:     General: No focal deficit present.     Mental Status: She is alert and oriented to person, place, and time.  Psychiatric:        Mood and Affect: Mood normal.        Behavior:  Behavior normal.        Thought Content: Thought content normal.        Judgment: Judgment normal.       Impression and Plan:  CKD (chronic kidney disease) stage 2, GFR 60-89 ml/min  IGT (impaired glucose tolerance)  Palpitations  Obesity (BMI 35.0-39.9 without comorbidity)   -Kidney disease is at baseline with a creatinine of 1.170. -Palpitations have been relatively well-controlled, followed by Dr. Tenny Craw. -A1c is stable at 6.1.  We have discussed importance of lifestyle changes. -Discussed healthy lifestyle, including increased physical activity and better food choices to promote weight loss.   Time spent:31 minutes reviewing chart, interviewing and examining patient and formulating plan of care.     Chaya Jan, MD [facility]  at [facility]

## 2023-04-29 DIAGNOSIS — Z01411 Encounter for gynecological examination (general) (routine) with abnormal findings: Secondary | ICD-10-CM | POA: Diagnosis not present

## 2023-04-29 DIAGNOSIS — R519 Headache, unspecified: Secondary | ICD-10-CM | POA: Diagnosis not present

## 2023-04-29 DIAGNOSIS — Z1231 Encounter for screening mammogram for malignant neoplasm of breast: Secondary | ICD-10-CM | POA: Diagnosis not present

## 2023-07-09 ENCOUNTER — Ambulatory Visit (HOSPITAL_COMMUNITY): Admission: RE | Admit: 2023-07-09 | Payer: BC Managed Care – PPO | Source: Ambulatory Visit

## 2023-07-29 ENCOUNTER — Telehealth (HOSPITAL_COMMUNITY): Payer: Self-pay | Admitting: Internal Medicine

## 2023-07-29 NOTE — Telephone Encounter (Signed)
 Patient cancelled Stress echo at [facility] x 3 - order will be  removed from the Echo WQ. If patient calls back we will reinstate the order. Thak you.    07/29/2023 11:17 AM By:  By: Aurora Mask  Cancel Rsn: Patient (work 3rd cancellation)

## 2023-07-30 ENCOUNTER — Ambulatory Visit (HOSPITAL_COMMUNITY): Admission: RE | Admit: 2023-07-30 | Payer: BC Managed Care – PPO | Source: Ambulatory Visit

## 2024-05-17 ENCOUNTER — Encounter: Payer: Self-pay | Admitting: Internal Medicine

## 2024-05-17 ENCOUNTER — Ambulatory Visit: Admitting: Internal Medicine

## 2024-05-17 VITALS — BP 124/80 | HR 101 | Temp 98.5°F | Wt 202.4 lb

## 2024-05-17 DIAGNOSIS — N182 Chronic kidney disease, stage 2 (mild): Secondary | ICD-10-CM

## 2024-05-17 DIAGNOSIS — R7302 Impaired glucose tolerance (oral): Secondary | ICD-10-CM

## 2024-05-17 DIAGNOSIS — Z114 Encounter for screening for human immunodeficiency virus [HIV]: Secondary | ICD-10-CM

## 2024-05-17 DIAGNOSIS — E559 Vitamin D deficiency, unspecified: Secondary | ICD-10-CM

## 2024-05-17 DIAGNOSIS — Z0000 Encounter for general adult medical examination without abnormal findings: Secondary | ICD-10-CM

## 2024-05-17 DIAGNOSIS — Z6837 Body mass index (BMI) 37.0-37.9, adult: Secondary | ICD-10-CM

## 2024-05-17 DIAGNOSIS — E669 Obesity, unspecified: Secondary | ICD-10-CM

## 2024-05-17 DIAGNOSIS — Z1159 Encounter for screening for other viral diseases: Secondary | ICD-10-CM

## 2024-05-17 LAB — COMPREHENSIVE METABOLIC PANEL WITH GFR
ALT: 10 U/L (ref 3–35)
AST: 13 U/L (ref 5–37)
Albumin: 3.9 g/dL (ref 3.5–5.2)
Alkaline Phosphatase: 65 U/L (ref 39–117)
BUN: 10 mg/dL (ref 6–23)
CO2: 24 meq/L (ref 19–32)
Calcium: 8.8 mg/dL (ref 8.4–10.5)
Chloride: 105 meq/L (ref 96–112)
Creatinine, Ser: 1.08 mg/dL (ref 0.40–1.20)
GFR: 60.08 mL/min
Glucose, Bld: 110 mg/dL — ABNORMAL HIGH (ref 70–99)
Potassium: 3.5 meq/L (ref 3.5–5.1)
Sodium: 136 meq/L (ref 135–145)
Total Bilirubin: 0.5 mg/dL (ref 0.2–1.2)
Total Protein: 7.4 g/dL (ref 6.0–8.3)

## 2024-05-17 LAB — CBC WITH DIFFERENTIAL/PLATELET
Basophils Absolute: 0.1 K/uL (ref 0.0–0.1)
Basophils Relative: 0.7 % (ref 0.0–3.0)
Eosinophils Absolute: 0 K/uL (ref 0.0–0.7)
Eosinophils Relative: 0.6 % (ref 0.0–5.0)
HCT: 39.5 % (ref 36.0–46.0)
Hemoglobin: 12.7 g/dL (ref 12.0–15.0)
Lymphocytes Relative: 28.5 % (ref 12.0–46.0)
Lymphs Abs: 2.6 K/uL (ref 0.7–4.0)
MCHC: 32.1 g/dL (ref 30.0–36.0)
MCV: 77.1 fl — ABNORMAL LOW (ref 78.0–100.0)
Monocytes Absolute: 0.4 K/uL (ref 0.1–1.0)
Monocytes Relative: 5 % (ref 3.0–12.0)
Neutro Abs: 5.9 K/uL (ref 1.4–7.7)
Neutrophils Relative %: 65.2 % (ref 43.0–77.0)
Platelets: 334 K/uL (ref 150.0–400.0)
RBC: 5.12 Mil/uL — ABNORMAL HIGH (ref 3.87–5.11)
RDW: 16.4 % — ABNORMAL HIGH (ref 11.5–15.5)
WBC: 9 K/uL (ref 4.0–10.5)

## 2024-05-17 LAB — LIPID PANEL
Cholesterol: 132 mg/dL (ref 28–200)
HDL: 45.6 mg/dL
LDL Cholesterol: 74 mg/dL (ref 10–99)
NonHDL: 86.85
Total CHOL/HDL Ratio: 3
Triglycerides: 66 mg/dL (ref 10.0–149.0)
VLDL: 13.2 mg/dL (ref 0.0–40.0)

## 2024-05-17 LAB — HEPATITIS C ANTIBODY: Hepatitis C Ab: NONREACTIVE

## 2024-05-17 LAB — VITAMIN D 25 HYDROXY (VIT D DEFICIENCY, FRACTURES): VITD: 20.42 ng/mL — ABNORMAL LOW (ref 30.00–100.00)

## 2024-05-17 LAB — HEMOGLOBIN A1C: Hgb A1c MFr Bld: 6.3 % (ref 4.6–6.5)

## 2024-05-17 LAB — HIV ANTIBODY (ROUTINE TESTING W REFLEX)
HIV 1&2 Ab, 4th Generation: NONREACTIVE
HIV FINAL INTERPRETATION: NEGATIVE

## 2024-05-17 LAB — VITAMIN B12: Vitamin B-12: 352 pg/mL (ref 211–911)

## 2024-05-17 LAB — TSH: TSH: 2.2 u[IU]/mL (ref 0.35–5.50)

## 2024-05-17 NOTE — Progress Notes (Signed)
 "    Established Patient Office Visit     CC/Reason for Visit: Annual preventive exam  HPI: Zoe Morris is a 48 y.o. female who is coming in today for the above mentioned reasons. Past Medical History is significant for: Obesity, impaired glucose tolerance, chronic kidney disease stage II, vitamin D  deficiency.  Feeling well, no concerns or complaints.  Is overdue for her annual GYN care during which she gets her mammogram and will call to schedule.  She is due for COVID, flu, pneumonia, shingles vaccines.   Past Medical/Surgical History: Past Medical History:  Diagnosis Date   Anxiety    hx of   Asthma    Chronic kidney disease    stage 2   Complete tear of right ACL    Current tear knee, medial meniscus    Depression    hx of   Palpitations    Vitamin D  deficiency     Past Surgical History:  Procedure Laterality Date   KNEE ARTHROSCOPY WITH ANTERIOR CRUCIATE LIGAMENT (ACL) REPAIR WITH HAMSTRING GRAFT Right 12/23/2017   Procedure: RIGHT KNEE ARTHROSCOPY WITH ANTERIOR CRUCIATE LIGAMENT (ACL) REPAIR WITH ALLOGRAFT HAMSTRING GRAFT;  Surgeon: Jane Charleston, MD;  Location: [facility];  Service: Orthopedics;  Laterality: Right;   KNEE ARTHROSCOPY WITH MEDIAL MENISECTOMY Right 12/23/2017   Procedure: RIGHT KNEE ARTHROSCOPY WITH MEDIAL MENISECTOMY;  Surgeon: Jane Charleston, MD;  Location: [facility];  Service: Orthopedics;  Laterality: Right;   TONSILLECTOMY AND ADENOIDECTOMY      Social History:  reports that she has quit smoking. Her smoking use included cigarettes. She has never used smokeless tobacco. She reports current alcohol use. She reports that she does not use drugs.  Allergies: Allergies[1]  Family History:  Family History  Problem Relation Age of Onset   COPD Mother    Multiple myeloma Father    Prostate cancer Paternal Grandfather    Colon polyps Neg Hx    Colon cancer Neg Hx    Esophageal cancer Neg Hx    Stomach  cancer Neg Hx    Rectal cancer Neg Hx     Current Medications[2]  Review of Systems:  Negative unless indicated in HPI.   Physical Exam: Vitals:   05/17/24 1010  BP: 124/80  Pulse: (!) 101  Temp: 98.5 F (36.9 C)  SpO2: 98%  Weight: 202 lb 6.4 oz (91.8 kg)  HC: 12 (30.5 cm)    Body mass index is 37.02 kg/m.   Physical Exam Vitals reviewed.  Constitutional:      General: She is not in acute distress.    Appearance: Normal appearance. She is obese. She is not ill-appearing, toxic-appearing or diaphoretic.  HENT:     Head: Normocephalic.     Right Ear: Tympanic membrane, ear canal and external ear normal. There is no impacted cerumen.     Left Ear: Tympanic membrane, ear canal and external ear normal. There is no impacted cerumen.     Nose: Nose normal.     Mouth/Throat:     Mouth: Mucous membranes are moist.     Pharynx: Oropharynx is clear. No oropharyngeal exudate or posterior oropharyngeal erythema.  Eyes:     General: No scleral icterus.       Right eye: No discharge.        Left eye: No discharge.     Conjunctiva/sclera: Conjunctivae normal.     Pupils: Pupils are equal, round, and reactive to light.  Neck:  Vascular: No carotid bruit.  Cardiovascular:     Rate and Rhythm: Normal rate and regular rhythm.     Pulses: Normal pulses.     Heart sounds: Normal heart sounds.  Pulmonary:     Effort: Pulmonary effort is normal. No respiratory distress.     Breath sounds: Normal breath sounds.  Abdominal:     General: Abdomen is flat. Bowel sounds are normal.     Palpations: Abdomen is soft.  Musculoskeletal:        General: Normal range of motion.     Cervical back: Normal range of motion.  Skin:    General: Skin is warm and dry.  Neurological:     General: No focal deficit present.     Mental Status: She is alert and oriented to person, place, and time. Mental status is at baseline.  Psychiatric:        Mood and Affect: Mood normal.        Behavior:  Behavior normal.        Thought Content: Thought content normal.        Judgment: Judgment normal.     Flowsheet Row Office Visit from 05/17/2024 in [facility] [place]  PHQ-9 Total Score 5    Impression and Plan:  Encounter for preventive health examination  CKD (chronic kidney disease) stage 2, GFR 60-89 ml/min -     CBC with Differential/Platelet; Future -     Comprehensive metabolic panel with GFR; Future  IGT (impaired glucose tolerance) -     Hemoglobin A1c; Future  Vitamin D  deficiency -     VITAMIN D  25 Hydroxy (Vit-D Deficiency, Fractures); Future  Encounter for hepatitis C screening test for low risk patient -     Hepatitis C antibody; Future  Encounter for screening for HIV -     HIV Antibody (routine testing w rflx); Future  Obesity (BMI 35.0-39.9 without comorbidity) -     Lipid panel; Future -     TSH; Future -     Vitamin B12; Future   -Recommend routine eye and dental care. -Healthy lifestyle discussed in detail. -Labs to be updated today. -Prostate cancer screening: Not applicable Health Maintenance  Topic Date Due   HIV Screening  Never done   Hepatitis C Screening  Never done   Pneumococcal Vaccine for age over 50 (1 of 2 - PCV) Never done   Hepatitis B Vaccine (1 of 3 - 19+ 3-dose series) Never done   Zoster (Shingles) Vaccine (1 of 2) Never done   Pap with HPV screening  Never done   Breast Cancer Screening  03/13/2023   COVID-19 Vaccine (1) 06/02/2024*   Flu Shot  08/23/2024*   DTaP/Tdap/Td vaccine (2 - Tdap) 02/27/2025   Colon Cancer Screening  01/26/2031   HPV Vaccine  Aged Out   Meningitis B Vaccine  Aged Out  *Topic was postponed. The date shown is not the original due date.    - Declines vaccines today despite counseling. - She will schedule follow-up with GYN for Pap smear and mammogram. - Colonoscopy is up-to-date.    Tully Theophilus Andrews, MD [facility] at [facility]     [1] No Known  Allergies [2]  Current Outpatient Medications:    paragard  intrauterine copper  IUD IUD, Take 1 device by intrauterine route., Disp: , Rfl:   "

## 2024-05-18 ENCOUNTER — Encounter: Payer: Self-pay | Admitting: Internal Medicine

## 2024-05-24 ENCOUNTER — Ambulatory Visit: Payer: Self-pay | Admitting: Internal Medicine

## 2024-05-24 ENCOUNTER — Other Ambulatory Visit (HOSPITAL_COMMUNITY): Payer: Self-pay

## 2024-05-24 DIAGNOSIS — E559 Vitamin D deficiency, unspecified: Secondary | ICD-10-CM

## 2024-05-24 MED ORDER — VITAMIN D (ERGOCALCIFEROL) 1.25 MG (50000 UNIT) PO CAPS
50000.0000 [IU] | ORAL_CAPSULE | ORAL | 0 refills | Status: AC
  Filled 2024-05-24: qty 12, 84d supply, fill #0

## 2024-06-29 ENCOUNTER — Encounter (INDEPENDENT_AMBULATORY_CARE_PROVIDER_SITE_OTHER): Payer: Self-pay
